# Patient Record
Sex: Female | Born: 1975 | Race: White | Hispanic: No | Marital: Married | Smoking: Former smoker
Health system: Southern US, Community
[De-identification: ages and names within clinical notes are randomized; demographics above are authoritative.]

## PROBLEM LIST (undated history)

## (undated) DIAGNOSIS — M459 Ankylosing spondylitis of unspecified sites in spine: Secondary | ICD-10-CM

## (undated) DIAGNOSIS — G932 Benign intracranial hypertension: Secondary | ICD-10-CM

## (undated) DIAGNOSIS — D729 Disorder of white blood cells, unspecified: Secondary | ICD-10-CM

## (undated) DIAGNOSIS — D709 Neutropenia, unspecified: Secondary | ICD-10-CM

## (undated) DIAGNOSIS — E041 Nontoxic single thyroid nodule: Secondary | ICD-10-CM

## (undated) DIAGNOSIS — N643 Galactorrhea not associated with childbirth: Secondary | ICD-10-CM

## (undated) DIAGNOSIS — R5382 Chronic fatigue, unspecified: Secondary | ICD-10-CM

## (undated) HISTORY — DX: Benign intracranial hypertension: G93.2

## (undated) HISTORY — DX: Chronic fatigue, unspecified: R53.82

## (undated) HISTORY — DX: Neutropenia, unspecified: D70.9

## (undated) HISTORY — DX: Nontoxic single thyroid nodule: E04.1

## (undated) HISTORY — PX: BONE MARROW BIOPSY: SHX199

## (undated) HISTORY — DX: Ankylosing spondylitis of unspecified sites in spine: M45.9

## (undated) HISTORY — DX: Disorder of white blood cells, unspecified: D72.9

## (undated) HISTORY — PX: WISDOM TOOTH EXTRACTION: SHX21

## (undated) HISTORY — DX: Galactorrhea not associated with childbirth: N64.3

---

## 1999-01-24 HISTORY — PX: AXILLARY LYMPH NODE BIOPSY: SHX5737

## 2014-08-24 ENCOUNTER — Encounter: Payer: Self-pay | Admitting: Podiatry

## 2014-08-24 ENCOUNTER — Ambulatory Visit (INDEPENDENT_AMBULATORY_CARE_PROVIDER_SITE_OTHER): Payer: Managed Care, Other (non HMO) | Admitting: Podiatry

## 2014-08-24 ENCOUNTER — Ambulatory Visit (INDEPENDENT_AMBULATORY_CARE_PROVIDER_SITE_OTHER): Payer: Managed Care, Other (non HMO)

## 2014-08-24 VITALS — BP 118/76 | HR 67 | Resp 16 | Ht 64.0 in | Wt 150.0 lb

## 2014-08-24 DIAGNOSIS — M722 Plantar fascial fibromatosis: Secondary | ICD-10-CM

## 2014-08-24 MED ORDER — METHYLPREDNISOLONE 4 MG PO TBPK: ORAL_TABLET | ORAL | Status: DC

## 2014-08-24 MED ORDER — MELOXICAM 15 MG PO TABS: 15.0000 mg | ORAL_TABLET | Freq: Every day | ORAL | Status: DC

## 2014-08-24 NOTE — Patient Instructions (Signed)

## 2014-08-24 NOTE — Progress Notes (Signed)
   Subjective:    Patient ID: Jenny Castillo, female    DOB: Apr 05, 1975, 39 y.o.   MRN: [id number]  HPI  Right heel pain for about 3 months now .   Review of Systems     Objective:   Physical Exam: I have reviewed her past medical history medications allergy surgery social history and review of systems. Pulses are strongly palpable bilateral. An neurologic sensorium is intact per Semmes-Weinstein monofilament. Deep tendon reflexes are intact bilateral and muscle strength +5 over 5 dorsiflexion plantar flexors and inverters everters all intrinsic musculature is intact. Orthopedic evaluation demonstrate all joints distal to the ankle for range of motion without crepitation. Cutaneous evaluation of a straight supple well-hydrated cutis no erythema or edema cellulitis drainage or odor. Radiograph confirm soft tissue increase in density of the plantar fascial calcaneal insertion site of the right heel. She has pain on palpation medial calcaneal tubercle of the right heel.        Assessment & Plan:  Assessment: Findings consistent with plantar fasciitis of the right heel.  Plan: Discussed etiology pathology conservative versus surgical therapies. Start her on a Medrol Dosepak to be followed by meloxicam. Injected the right heel today with Kenalog and local and aesthetic. Place her in a plantar fascial brace and a night splint. Discussed appropriate shoe gear stretching exercises ice therapy and she can modifications. I will follow-up with her in 1 month.

## 2014-09-30 ENCOUNTER — Ambulatory Visit (INDEPENDENT_AMBULATORY_CARE_PROVIDER_SITE_OTHER): Payer: Managed Care, Other (non HMO) | Admitting: Podiatry

## 2014-09-30 ENCOUNTER — Encounter: Payer: Self-pay | Admitting: Podiatry

## 2014-09-30 VITALS — BP 119/80 | HR 56 | Resp 18

## 2014-09-30 DIAGNOSIS — M722 Plantar fascial fibromatosis: Secondary | ICD-10-CM | POA: Diagnosis not present

## 2014-09-30 NOTE — Progress Notes (Signed)
She presents today for follow-up of her plantar fasciitis right foot. She states that is approximately 90% improved she states that recently she has noticed is starting to become a little bit more stiff and tender and she takes her meloxicam on those days.  Objective: Vital signs are stable she is alert and oriented 3 she has no pain on palpation medial calcaneal tubercle of the right heel. Ulcers remain palpable neurologic sensorium is intact. Muscle strength is normal. She has much decrease in edema and warmth to the right heel from previous evaluation.  Assessment: Resolving plantar fasciitis 90% right.  Plan: I encouraged her to continue her medication her braces and night splint until she is 100% improved +1 month. I will follow-up with her in 1 month if necessary. We may need to consider orthotics.

## 2014-10-19 ENCOUNTER — Ambulatory Visit (INDEPENDENT_AMBULATORY_CARE_PROVIDER_SITE_OTHER): Payer: Managed Care, Other (non HMO) | Admitting: Internal Medicine

## 2014-10-19 ENCOUNTER — Encounter: Payer: Self-pay | Admitting: Internal Medicine

## 2014-10-19 ENCOUNTER — Encounter (INDEPENDENT_AMBULATORY_CARE_PROVIDER_SITE_OTHER): Payer: Self-pay

## 2014-10-19 VITALS — BP 118/64 | HR 65 | Temp 98.1°F | Ht 63.75 in | Wt 156.0 lb

## 2014-10-19 DIAGNOSIS — M722 Plantar fascial fibromatosis: Secondary | ICD-10-CM

## 2014-10-19 DIAGNOSIS — R635 Abnormal weight gain: Secondary | ICD-10-CM | POA: Diagnosis not present

## 2014-10-19 DIAGNOSIS — R61 Generalized hyperhidrosis: Secondary | ICD-10-CM | POA: Diagnosis not present

## 2014-10-19 DIAGNOSIS — D700 Congenital agranulocytosis: Secondary | ICD-10-CM | POA: Diagnosis not present

## 2014-10-19 DIAGNOSIS — R221 Localized swelling, mass and lump, neck: Secondary | ICD-10-CM

## 2014-10-19 DIAGNOSIS — N951 Menopausal and female climacteric states: Secondary | ICD-10-CM

## 2014-10-19 DIAGNOSIS — D709 Neutropenia, unspecified: Secondary | ICD-10-CM | POA: Insufficient documentation

## 2014-10-19 DIAGNOSIS — R232 Flushing: Secondary | ICD-10-CM

## 2014-10-19 LAB — CBC WITH DIFFERENTIAL/PLATELET
Basophils Absolute: 0 10*3/uL (ref 0.0–0.1)
Basophils Relative: 0.7 % (ref 0.0–3.0)
Eosinophils Absolute: 0 10*3/uL (ref 0.0–0.7)
Eosinophils Relative: 1.5 % (ref 0.0–5.0)
HCT: 38.1 % (ref 36.0–46.0)
Hemoglobin: 12.9 g/dL (ref 12.0–15.0)
Lymphocytes Relative: 49.9 % — ABNORMAL HIGH (ref 12.0–46.0)
Lymphs Abs: 1.4 10*3/uL (ref 0.7–4.0)
MCHC: 33.8 g/dL (ref 30.0–36.0)
MCV: 93.1 fl (ref 78.0–100.0)
Monocytes Absolute: 0.4 10*3/uL (ref 0.1–1.0)
Monocytes Relative: 13.8 % — ABNORMAL HIGH (ref 3.0–12.0)
Neutro Abs: 0.9 10*3/uL — ABNORMAL LOW (ref 1.4–7.7)
Neutrophils Relative %: 34.1 % — ABNORMAL LOW (ref 43.0–77.0)
Platelets: 164 10*3/uL (ref 150.0–400.0)
RBC: 4.09 Mil/uL (ref 3.87–5.11)
RDW: 13.2 % (ref 11.5–15.5)
WBC: 2.7 10*3/uL — ABNORMAL LOW (ref 4.0–10.5)

## 2014-10-19 LAB — LUTEINIZING HORMONE: LH: 4.72 m[IU]/mL

## 2014-10-19 LAB — TSH: TSH: 2.27 u[IU]/mL (ref 0.35–4.50)

## 2014-10-19 LAB — FOLLICLE STIMULATING HORMONE: FSH: 12.3 m[IU]/mL

## 2014-10-19 NOTE — Addendum Note (Signed)
Addended by: Baldomero Lamy on: 10/19/2014 01:48 PM   Modules accepted: Orders

## 2014-10-19 NOTE — Addendum Note (Signed)
Addended by: Alvina Chou on: 10/19/2014 01:09 PM   Modules accepted: Orders

## 2014-10-19 NOTE — Progress Notes (Signed)
Pre visit review using our clinic review tool, if applicable. No additional management support is needed unless otherwise documented below in the visit note. 

## 2014-10-19 NOTE — Assessment & Plan Note (Signed)
Will check CBC with diff today If WBC < 3 will refer to [facility] or [facility] for further evaluation

## 2014-10-19 NOTE — Assessment & Plan Note (Signed)
Not bothering her at this time Will continue to monitor Continue Meloxicam as needed

## 2014-10-19 NOTE — Progress Notes (Signed)
HPI  Pt presents to the clinic today to establish care and for management of the conditions listed below. She moved from [place] 1 year ago.  Immune Neutropenia: Diagnosed in 97-98. She used to follow with a hematologist. She has had 2 bone marrow biopsies. She has had enlarged lymph nodes in various places in the past. She reports that they told her what looks like Leukemia but all the testing has been inconclusive. Her WBC usually runs around 3.  Plantar fasciitis of right foot: This only bothers her intermittently. It is usually worse first thing in the morning. She takes Meloxicam as needed with good relief.  She does feel like she has a lump in her throat. This only occurs intermittently. She only really notices it when she coughs. She denies difficulty swallowing or shortness of breath. She denies sore throat or reflux. She has had some intermittent left ear pain and a swollen lymph node in front of her left ear but this has also gotten better. She feels like she comes down with a cold and then her symptoms improve.  She is also concerned about night sweats, hot flashes and difficulty losing weight. This has been going on for a few months. She wakes up in the middle of the night, drenched in sweat. She gets hot randomly throughout the day. She adheres to a strict diet and exercises 3-5 times per week but reports she has gained 10 lbs. She is not sure at what age her mother went through menopause because she had a hysterectomy due to cancer.  Flu: never Tetanus: had one, but not sure last one LMP: Had Mirena removed in August 2015. Her last 2 periods have been 1-2 days. Pap Smear: 2014 Mammogram: 2014 Vision Screening: yearly Dentist: biannually    Past Medical History  Diagnosis Date  . Immune neutropenia   . Thyroid nodule   . Abnormal white blood cell (WBC)     low    Current Outpatient Prescriptions  Medication Sig Dispense Refill  . meloxicam (MOBIC) 15 MG tablet Take 1  tablet (15 mg total) by mouth daily. 30 tablet 3   No current facility-administered medications for this visit.    Allergies  Allergen Reactions  . Ceftin [Cefuroxime Axetil] Other (See Comments)  . Reglan [Metoclopramide] Other (See Comments)    Heavy pressure in chest , can't breathe  . Septra [Sulfamethoxazole-Trimethoprim] Other (See Comments)    Has a low blood count has to stay away from certain medications   . Sulfa Antibiotics Other (See Comments)  . Rocephin [Ceftriaxone Sodium In Dextrose] Rash    Swelling     Family History  Problem Relation Age of Onset  . Cancer Mother   . Stroke Father   . Hypertension Father   . Asthma Maternal Grandmother   . Hypertension Maternal Grandmother   . Stroke Paternal Grandmother     Social History   Social History  . Marital Status: Married    Spouse Name: N/A  . Number of Children: N/A  . Years of Education: N/A   Occupational History  . Not on file.   Social History Main Topics  . Smoking status: Former Games developer  . Smokeless tobacco: Never Used  . Alcohol Use: 0.0 oz/week    0 Standard drinks or equivalent per week  . Drug Use: No  . Sexual Activity: Yes   Other Topics Concern  . Not on file   Social History Narrative    ROS:  Constitutional: Pt  reports weight gain. Denies fever, malaise, fatigue, headache.  HEENT: Denies eye pain, eye redness, ear pain, ringing in the ears, wax buildup, runny nose, nasal congestion, bloody nose, or sore throat. Respiratory: Denies difficulty breathing, shortness of breath, cough or sputum production.   Cardiovascular: Denies chest pain, chest tightness, palpitations or swelling in the hands or feet.  Gastrointestinal: Denies abdominal pain, bloating, constipation, diarrhea or blood in the stool.  GU: Denies frequency, urgency, pain with urination, blood in urine, odor or discharge. Musculoskeletal: Denies decrease in range of motion, difficulty with gait, muscle pain or joint  pain and swelling.  Skin: Denies redness, rashes, lesions or ulcercations.  Neurological: Denies dizziness, difficulty with memory, difficulty with speech or problems with balance and coordination.  Psych: Denies anxiety, depression, SI/HI.  No other specific complaints in a complete review of systems (except as listed in HPI above).  PE:  BP 118/64 mmHg  Pulse 65  Temp(Src) 98.1 F (36.7 C) (Oral)  Ht 5' 3.75" (1.619 m)  Wt 156 lb (70.761 kg)  BMI 27.00 kg/m2  SpO2 98%  LMP 10/17/2014 Wt Readings from Last 3 Encounters:  10/19/14 156 lb (70.761 kg)  08/24/14 150 lb (68.04 kg)    General: Appears her stated age, well developed, well nourished in NAD. Skin: Warm, dry and intact. No rashes, lesions or ulcerations noted. HEENT: Head: normal shape and size; Eyes: sclera white, no icterus, conjunctiva pink; Ears: Tm's gray and intact, normal light reflex; Throat/Mouth: Teeth present, mucosa pink and moist, no lesions or ulcerations noted.  Neck: No adenopathy noted. Cardiovascular: Normal rate and rhythm. S1,S2 noted.  No murmur, rubs or gallops noted. Pulmonary/Chest: Normal effort and positive vesicular breath sounds. No respiratory distress. No wheezes, rales or ronchi noted.  Neurological: Alert and oriented.  Psychiatric: Mood and affect normal. Behavior is normal. Judgment and thought content normal.    Assessment and Plan:  Weight gain, hot flashes and night sweats:  Sounds perimenopausal Will check TSH, FSH and LH today If labs normal- she will consider going to gyn to have Mirena put back in  Lump in throat with intermittent left ear pain:  Sounds like it could be allergy related Advised her to take Allegra once daily to see if symptoms improve  RTC in 3 months or when convenient for an annual exam

## 2014-10-19 NOTE — Patient Instructions (Signed)
Menopause Menopause is the normal time of life when menstrual periods stop completely. Menopause is complete when you have missed 12 consecutive menstrual periods. It usually occurs between the ages of 48 years and 55 years. Very rarely does a woman develop menopause before the age of 40 years. At menopause, your ovaries stop producing the female hormones estrogen and progesterone. This can cause undesirable symptoms and also affect your health. Sometimes the symptoms may occur 4-5 years before the menopause begins. There is no relationship between menopause and:  Oral contraceptives.  Number of children you had.  Race.  The age your menstrual periods started (menarche). Heavy smokers and very thin women may develop menopause earlier in life. CAUSES  The ovaries stop producing the female hormones estrogen and progesterone.  Other causes include:  Surgery to remove both ovaries.  The ovaries stop functioning for no known reason.  Tumors of the pituitary gland in the brain.  Medical disease that affects the ovaries and hormone production.  Radiation treatment to the abdomen or pelvis.  Chemotherapy that affects the ovaries. SYMPTOMS   Hot flashes.  Night sweats.  Decrease in sex drive.  Vaginal dryness and thinning of the vagina causing painful intercourse.  Dryness of the skin and developing wrinkles.  Headaches.  Tiredness.  Irritability.  Memory problems.  Weight gain.  Bladder infections.  Hair growth of the face and chest.  Infertility. More serious symptoms include:  Loss of bone (osteoporosis) causing breaks (fractures).  Depression.  Hardening and narrowing of the arteries (atherosclerosis) causing heart attacks and strokes. DIAGNOSIS   When the menstrual periods have stopped for 12 straight months.  Physical exam.  Hormone studies of the blood. TREATMENT  There are many treatment choices and nearly as many questions about them. The  decisions to treat or not to treat menopausal changes is an individual choice made with your health care provider. Your health care provider can discuss the treatments with you. Together, you can decide which treatment will work best for you. Your treatment choices may include:   Hormone therapy (estrogen and progesterone).  Non-hormonal medicines.  Treating the individual symptoms with medicine (for example antidepressants for depression).  Herbal medicines that may help specific symptoms.  Counseling by a psychiatrist or psychologist.  Group therapy.  Lifestyle changes including:  Eating healthy.  Regular exercise.  Limiting caffeine and alcohol.  Stress management and meditation.  No treatment. HOME CARE INSTRUCTIONS   Take the medicine your health care provider gives you as directed.  Get plenty of sleep and rest.  Exercise regularly.  Eat a diet that contains calcium (good for the bones) and soy products (acts like estrogen hormone).  Avoid alcoholic beverages.  Do not smoke.  If you have hot flashes, dress in layers.  Take supplements, calcium, and vitamin D to strengthen bones.  You can use over-the-counter lubricants or moisturizers for vaginal dryness.  Group therapy is sometimes very helpful.  Acupuncture may be helpful in some cases. SEEK MEDICAL CARE IF:   You are not sure you are in menopause.  You are having menopausal symptoms and need advice and treatment.  You are still having menstrual periods after age 55 years.  You have pain with intercourse.  Menopause is complete (no menstrual period for 12 months) and you develop vaginal bleeding.  You need a referral to a specialist (gynecologist, psychiatrist, or psychologist) for treatment. SEEK IMMEDIATE MEDICAL CARE IF:   You have severe depression.  You have excessive vaginal bleeding.    You fell and think you have a broken bone.  You have pain when you urinate.  You develop leg or  chest pain.  You have a fast pounding heart beat (palpitations).  You have severe headaches.  You develop vision problems.  You feel a lump in your breast.  You have abdominal pain or severe indigestion. Document Released: 04/01/2003 Document Revised: 09/11/2012 Document Reviewed: 08/08/2012 ExitCare Patient Information 2015 [employer]. This information is not intended to replace advice given to you by your health care provider. Make sure you discuss any questions you have with your health care provider.  

## 2014-12-24 ENCOUNTER — Encounter: Payer: Self-pay | Admitting: Internal Medicine

## 2014-12-24 ENCOUNTER — Ambulatory Visit (INDEPENDENT_AMBULATORY_CARE_PROVIDER_SITE_OTHER): Payer: Managed Care, Other (non HMO) | Admitting: Internal Medicine

## 2014-12-24 VITALS — BP 114/80 | HR 69 | Temp 98.2°F | Wt 155.0 lb

## 2014-12-24 DIAGNOSIS — M2550 Pain in unspecified joint: Secondary | ICD-10-CM

## 2014-12-24 LAB — SEDIMENTATION RATE: Sed Rate: 10 mm/hr (ref 0–22)

## 2014-12-24 LAB — URIC ACID: Uric Acid, Serum: 4.1 mg/dL (ref 2.4–7.0)

## 2014-12-24 NOTE — Progress Notes (Signed)
Subjective:    Patient ID: Jenny Castillo, female    DOB: December 12, 1975, 39 y.o.   MRN: [id number]  HPI  Pt presents to the clinic today with c/o joint pain. She has pain in different joints at different times. Today the pain is in her left ankle. She describes the pain as shooting but stiff, "like someone is hitting me with a bat". The pain is constant but changes in intensity with activity. She denies redness or swelling of the joints. She takes Ibuprofen with some relief. She has no history of gout or autoimmune disorders. She denies injury to any of the joints.  Review of Systems      Past Medical History  Diagnosis Date  . Immune neutropenia ([place])   . Thyroid nodule   . Abnormal white blood cell (WBC)     low    Current Outpatient Prescriptions  Medication Sig Dispense Refill  . meloxicam (MOBIC) 15 MG tablet Take 1 tablet (15 mg total) by mouth daily. (Patient not taking: Reported on 12/24/2014) 30 tablet 3   No current facility-administered medications for this visit.    Allergies  Allergen Reactions  . Ceftin [Cefuroxime Axetil] Other (See Comments)  . Reglan [Metoclopramide] Other (See Comments)    Heavy pressure in chest , can't breathe  . Septra [Sulfamethoxazole-Trimethoprim] Other (See Comments)    Has a low blood count has to stay away from certain medications   . Sulfa Antibiotics Other (See Comments)  . Rocephin [Ceftriaxone Sodium In Dextrose] Rash    Swelling     Family History  Problem Relation Age of Onset  . Cancer Mother     kidney  . Stroke Father   . Hypertension Father   . Asthma Maternal Grandmother   . Hypertension Maternal Grandmother   . Stroke Paternal Grandmother     Social History   Social History  . Marital Status: Married    Spouse Name: N/A  . Number of Children: N/A  . Years of Education: N/A   Occupational History  . Not on file.   Social History Main Topics  . Smoking status: Former Research scientist (life sciences)  . Smokeless tobacco: Never Used    . Alcohol Use: 0.0 oz/week    0 Standard drinks or equivalent per week  . Drug Use: No  . Sexual Activity: Yes   Other Topics Concern  . Not on file   Social History Narrative     Constitutional: Denies fever, malaise, fatigue, headache or abrupt weight changes.  Respiratory: Denies difficulty breathing, shortness of breath, cough or sputum production.   Cardiovascular: Denies chest pain, chest tightness, palpitations or swelling in the hands or feet.  Musculoskeletal: Pt reports multiple joint pain. Denies decrease in range of motion, difficulty with gait, muscle pain or joint swelling.  Skin: Denies redness, rashes, lesions or ulcercations.    No other specific complaints in a complete review of systems (except as listed in HPI above).  Objective:   Physical Exam   BP 114/80 mmHg  Pulse 69  Temp(Src) 98.2 F (36.8 C) (Oral)  Wt 155 lb (70.308 kg)  SpO2 98%  LMP 12/14/2014 Wt Readings from Last 3 Encounters:  12/24/14 155 lb (70.308 kg)  10/19/14 156 lb (70.761 kg)  08/24/14 150 lb (68.04 kg)    General: Appears her stated age, well developed, well nourished in NAD. Skin: Warm, dry and intact. No rashes, lesions or ulcerations noted. Cardiovascular: Normal rate and rhythm. S1,S2 noted.  No murmur, rubs or  gallops noted.  Pulmonary/Chest: Normal effort and positive vesicular breath sounds. No respiratory distress. No wheezes, rales or ronchi noted.  Musculoskeletal: Normal flexion, extension and rotation of the left ankle. Pain with palpation over the medial malleolus. No signs of joint swelling. No difficulty with gait.  Neurological: Alert and oriented.   BMET No results found for: NA, K, CL, CO2, GLUCOSE, BUN, CREATININE, CALCIUM, GFRNONAA, GFRAA  Lipid Panel  No results found for: CHOL, TRIG, HDL, CHOLHDL, VLDL, LDLCALC  CBC    Component Value Date/Time   WBC 2.7* 10/19/2014 1359   RBC 4.09 10/19/2014 1359   HGB 12.9 10/19/2014 1359   HCT 38.1 10/19/2014  1359   PLT 164.0 10/19/2014 1359   MCV 93.1 10/19/2014 1359   MCHC 33.8 10/19/2014 1359   RDW 13.2 10/19/2014 1359   LYMPHSABS 1.4 10/19/2014 1359   MONOABS 0.4 10/19/2014 1359   EOSABS 0.0 10/19/2014 1359   BASOSABS 0.0 10/19/2014 1359    Hgb A1C No results found for: HGBA1C      Assessment & Plan:   Multiple joint pains:  ? Autoimmune d/o versus OA Will check ESR, RF, ANA, CCP and uric acid level today- if positive, will refer to rheumatology No joint deformity noted, I do not think xray would be of benefit at this time Start taking Meloxicam daily or Ibuprofen prn  RTC as needed or if symptoms persist or worsen

## 2014-12-24 NOTE — Patient Instructions (Signed)

## 2014-12-24 NOTE — Progress Notes (Signed)
Pre visit review using our clinic review tool, if applicable. No additional management support is needed unless otherwise documented below in the visit note. 

## 2014-12-25 LAB — ANTI-NUCLEAR AB-TITER (ANA TITER): ANA Titer 1: 1:160 {titer} — ABNORMAL HIGH

## 2014-12-25 LAB — CYCLIC CITRUL PEPTIDE ANTIBODY, IGG: Cyclic Citrullin Peptide Ab: <16 Units

## 2014-12-25 LAB — ANA: Anti Nuclear Antibody(ANA): POSITIVE — AB

## 2014-12-25 LAB — RHEUMATOID FACTOR: Rhuematoid fact SerPl-aCnc: <10 IU/mL (ref <=14)

## 2014-12-29 ENCOUNTER — Telehealth: Payer: Self-pay | Admitting: Internal Medicine

## 2014-12-29 NOTE — Telephone Encounter (Signed)
Pt called she saw her lab results on my chart but wanted to know what she needs to do next/ follow up? Nothing has changed since her visit with regina

## 2014-12-30 ENCOUNTER — Other Ambulatory Visit: Payer: Self-pay | Admitting: Internal Medicine

## 2014-12-30 DIAGNOSIS — R768 Other specified abnormal immunological findings in serum: Secondary | ICD-10-CM

## 2014-12-30 NOTE — Telephone Encounter (Signed)
Pt is aware ---see result note 

## 2015-01-19 DIAGNOSIS — R0600 Dyspnea, unspecified: Secondary | ICD-10-CM | POA: Insufficient documentation

## 2015-01-19 DIAGNOSIS — M2550 Pain in unspecified joint: Secondary | ICD-10-CM | POA: Insufficient documentation

## 2015-01-19 DIAGNOSIS — R5382 Chronic fatigue, unspecified: Secondary | ICD-10-CM | POA: Insufficient documentation

## 2015-01-19 DIAGNOSIS — R0609 Other forms of dyspnea: Secondary | ICD-10-CM

## 2015-01-19 DIAGNOSIS — R768 Other specified abnormal immunological findings in serum: Secondary | ICD-10-CM | POA: Insufficient documentation

## 2015-02-03 DIAGNOSIS — R768 Other specified abnormal immunological findings in serum: Secondary | ICD-10-CM | POA: Insufficient documentation

## 2015-03-17 ENCOUNTER — Telehealth: Payer: Self-pay | Admitting: Internal Medicine

## 2015-03-17 NOTE — Telephone Encounter (Signed)
Patient Name: Jenny Castillo  DOB: [birth year]    Initial Comment Caller states she has a fever, cough, and body aches, and fatigue. She's had a fever since last Friday. Has a blood disorder, and has questions about getting some lab work.   Nurse Assessment  Nurse: Stefano Gaul, RN, Dwana Curd Date/Time Lamount Cohen Time): 03/17/2015 12:06:12 PM  Confirm and document reason for call. If symptomatic, describe symptoms. You must click the next button to save text entered. ---Caller states she has a fever, cough, body aches. has had fever since last Friday. She has a low WBC. her daughter had the flu last week. Temp 101 during the night.  Has the patient traveled out of the country within the last 30 days? ---No  Does the patient have any new or worsening symptoms? ---Yes  Will a triage be completed? ---Yes  Related visit to physician within the last 2 weeks? ---No  Does the PT have any chronic conditions? (i.e. diabetes, asthma, etc.) ---Yes  List chronic conditions. ---low WBC  Is the patient pregnant or possibly pregnant? (Ask all females between the ages of 40-55) ---No  Is this a behavioral health or substance abuse call? ---No     Guidelines    Guideline Title Affirmed Question Affirmed Notes  Influenza - Seasonal Fever present > 3 days (72 hours)    Final Disposition User   See Physician within 24 Hours Stringer, RN, Dwana Curd    Comments  Pt would like WBC checked  Appt scheduled for 03/18/2015 at 9 am with [facility]   Referrals  REFERRED TO PCP OFFICE   Disagree/Comply: Danella Maiers

## 2015-03-17 NOTE — Telephone Encounter (Signed)
Appointment 2/23 at 0900 with R.Baity.

## 2015-03-18 ENCOUNTER — Encounter: Payer: Self-pay | Admitting: Internal Medicine

## 2015-03-18 ENCOUNTER — Ambulatory Visit (INDEPENDENT_AMBULATORY_CARE_PROVIDER_SITE_OTHER): Payer: Managed Care, Other (non HMO) | Admitting: Internal Medicine

## 2015-03-18 VITALS — BP 116/70 | HR 76 | Temp 97.8°F | Wt 153.0 lb

## 2015-03-18 DIAGNOSIS — J069 Acute upper respiratory infection, unspecified: Secondary | ICD-10-CM

## 2015-03-18 DIAGNOSIS — D709 Neutropenia, unspecified: Secondary | ICD-10-CM

## 2015-03-18 DIAGNOSIS — B9789 Other viral agents as the cause of diseases classified elsewhere: Secondary | ICD-10-CM

## 2015-03-18 DIAGNOSIS — H6693 Otitis media, unspecified, bilateral: Secondary | ICD-10-CM | POA: Diagnosis not present

## 2015-03-18 LAB — CBC WITH DIFFERENTIAL/PLATELET
Basophils Absolute: 0 10*3/uL (ref 0.0–0.1)
Basophils Relative: 0.7 % (ref 0.0–3.0)
Eosinophils Absolute: 0.1 10*3/uL (ref 0.0–0.7)
Eosinophils Relative: 3 % (ref 0.0–5.0)
HCT: 39.2 % (ref 36.0–46.0)
Hemoglobin: 13.3 g/dL (ref 12.0–15.0)
Lymphocytes Relative: 59.4 % — ABNORMAL HIGH (ref 12.0–46.0)
Lymphs Abs: 1.4 10*3/uL (ref 0.7–4.0)
MCHC: 33.9 g/dL (ref 30.0–36.0)
MCV: 90.7 fl (ref 78.0–100.0)
Monocytes Absolute: 0.5 10*3/uL (ref 0.1–1.0)
Monocytes Relative: 19.3 % — ABNORMAL HIGH (ref 3.0–12.0)
Neutro Abs: 0.4 10*3/uL — ABNORMAL LOW (ref 1.4–7.7)
Neutrophils Relative %: 17.6 % — ABNORMAL LOW (ref 43.0–77.0)
Platelets: 133 10*3/uL — ABNORMAL LOW (ref 150.0–400.0)
RBC: 4.33 Mil/uL (ref 3.87–5.11)
RDW: 13 % (ref 11.5–15.5)
WBC: 2.4 10*3/uL — ABNORMAL LOW (ref 4.0–10.5)

## 2015-03-18 MED ORDER — HYDROCODONE-HOMATROPINE 5-1.5 MG/5ML PO SYRP: 5.0000 mL | ORAL_SOLUTION | Freq: Three times a day (TID) | ORAL | Status: DC | PRN

## 2015-03-18 MED ORDER — AMOXICILLIN 500 MG PO CAPS: 500.0000 mg | ORAL_CAPSULE | Freq: Three times a day (TID) | ORAL | Status: DC

## 2015-03-18 NOTE — Patient Instructions (Signed)

## 2015-03-18 NOTE — Progress Notes (Signed)
Pre visit review using our clinic review tool, if applicable. No additional management support is needed unless otherwise documented below in the visit note. 

## 2015-03-18 NOTE — Progress Notes (Signed)
HPI  Pt presents to the clinic today with c/o ear pain, runny nose, cough and shortness of breath. This started 1 week ago.  She is blowing clear mucous out of her nose. The cough is non productive. She has run fevers up to 101. She has tried Ibuprofen, Claritin, Mucinix D with some relief. She denies history of seasonal allergies or breathing problems, but she does have immune neutropenia. She has had sick contacts.  Review of Systems      Past Medical History  Diagnosis Date  . Immune neutropenia ([facility])   . Thyroid nodule   . Abnormal white blood cell (WBC)     low    Family History  Problem Relation Age of Onset  . Cancer Mother     kidney  . Stroke Father   . Hypertension Father   . Asthma Maternal Grandmother   . Hypertension Maternal Grandmother   . Stroke Paternal Grandmother     Social History   Social History  . Marital Status: Married    Spouse Name: N/A  . Number of Children: N/A  . Years of Education: N/A   Occupational History  . Not on file.   Social History Main Topics  . Smoking status: Former Games developer  . Smokeless tobacco: Never Used  . Alcohol Use: 0.0 oz/week    0 Standard drinks or equivalent per week  . Drug Use: No  . Sexual Activity: Yes   Other Topics Concern  . Not on file   Social History Narrative    Allergies  Allergen Reactions  . Ceftin [Cefuroxime Axetil] Other (See Comments)  . Reglan [Metoclopramide] Other (See Comments)    Heavy pressure in chest , can't breathe  . Septra [Sulfamethoxazole-Trimethoprim] Other (See Comments)    Has a low blood count has to stay away from certain medications   . Sulfa Antibiotics Other (See Comments)  . Rocephin [Ceftriaxone Sodium In Dextrose] Rash    Swelling      Constitutional: Positive headache, fatigue and fever. Denies abrupt weight changes.  HEENT:  Positive ear pain, runny nose. Denies eye redness, eye pain, pressure behind the eyes, facial pain, nasal congestion, ringing in the  ears, wax buildup, or bloody nose. Respiratory: Positive cough and shortness of breath. Denies difficulty breathing.  Cardiovascular: Denies chest pain, chest tightness, palpitations or swelling in the hands or feet.   No other specific complaints in a complete review of systems (except as listed in HPI above).  Objective:   BP 116/70 mmHg  Pulse 76  Temp(Src) 97.8 F (36.6 C) (Oral)  Wt 153 lb (69.4 kg)  SpO2 98%  LMP 02/19/2015 Wt Readings from Last 3 Encounters:  03/18/15 153 lb (69.4 kg)  12/24/14 155 lb (70.308 kg)  10/19/14 156 lb (70.761 kg)     General: Appears her stated age,  in NAD. HEENT: Head: normal shape and size, no sinus tenderness noted; Eyes: sclera white, no icterus, conjunctiva pink; Ears: Tm's red but intact, distorted light reflex, + mucoid effusion bilaterally; Nose: mucosa boggy and moist, septum midline; Throat/Mouth: + PND. Teeth present, mucosa erythematous and moist, no exudate noted, no lesions or ulcerations noted.  Neck: No cervical lymphadenopathy.  Cardiovascular: Normal rate and rhythm. S1,S2 noted.  No murmur, rubs or gallops noted.  Pulmonary/Chest: Normal effort and positive vesicular breath sounds. No respiratory distress. No wheezes, rales or ronchi noted.      Assessment & Plan:   Viral Upper Respiratory Infection with Cough:  Get some  rest and drink plenty of water Continue Ibuprofen as needed eRx for Hycodan cough syrup  Bilateral Otitis Media:  eRx for Amoxil 500 mg TID x 10 days Ibuprofen for fever and inflammation  Immune Neutropenia:  CBC with diff today  RTC as needed or if symptoms persist.

## 2015-08-10 ENCOUNTER — Telehealth: Payer: Self-pay | Admitting: Internal Medicine

## 2015-08-10 NOTE — Telephone Encounter (Signed)
Patient Name: Jenny Castillo  DOB: 05-01-1975    Initial Comment Caller states having neck and head pain   Nurse Assessment  Nurse: Annye English RN, Denise Date/Time (Eastern Time): 08/10/2015 1:26:06 PM  Confirm and document reason for call. If symptomatic, describe symptoms. You must click the next button to save text entered. ---Caller states having neck and head pain  Has the patient traveled out of the country within the last 30 days? ---Not Applicable  Does the patient have any new or worsening symptoms? ---Yes  Will a triage be completed? ---Yes  Related visit to physician within the last 2 weeks? ---No  Does the PT have any chronic conditions? (i.e. diabetes, asthma, etc.) ---Yes  List chronic conditions. ---Immune Neutropenia  Is the patient pregnant or possibly pregnant? (Ask all females between the ages of 17-55) ---No  Is this a behavioral health or substance abuse call? ---No     Guidelines    Guideline Title Affirmed Question Affirmed Notes  Headache [1] SEVERE headache (e.g., excruciating) AND [2] "worst headache" of life    Final Disposition User   Go to ED Now (or PCP triage) Annye English, RN, Denise    Comments  Pt reluctant to go to ER. Advised to hold while I call the [facility] to see if she can be seen at the office today instead of going to ER. Advised it is important she be seen if the [facility] is unable to get her an appt due to her hx of Viral Meningitis. Verb understanding. Called [facility] backline and spoke to [place]ebel, office nurse w/pt report given. She states there are not any appts available today and the pt needs to proceed to ER as advised. Advised pt of above. Verb understanding.   Referrals  GO TO FACILITY UNDECIDED  GO TO FACILITY UNDECIDED   Disagree/Comply: Comply

## 2015-08-10 NOTE — Telephone Encounter (Signed)
Agree with advice given

## 2015-11-02 ENCOUNTER — Encounter: Payer: Self-pay | Admitting: Internal Medicine

## 2015-11-02 ENCOUNTER — Ambulatory Visit (INDEPENDENT_AMBULATORY_CARE_PROVIDER_SITE_OTHER): Payer: Managed Care, Other (non HMO) | Admitting: Internal Medicine

## 2015-11-02 VITALS — BP 120/80 | Temp 98.5°F | Wt 160.0 lb

## 2015-11-02 DIAGNOSIS — M2550 Pain in unspecified joint: Secondary | ICD-10-CM

## 2015-11-02 DIAGNOSIS — R5383 Other fatigue: Secondary | ICD-10-CM

## 2015-11-02 DIAGNOSIS — D709 Neutropenia, unspecified: Secondary | ICD-10-CM

## 2015-11-02 DIAGNOSIS — R222 Localized swelling, mass and lump, trunk: Secondary | ICD-10-CM

## 2015-11-02 LAB — CBC WITH DIFFERENTIAL/PLATELET
Basophils Absolute: 0 10*3/uL (ref 0.0–0.1)
Basophils Relative: 0.5 % (ref 0.0–3.0)
Eosinophils Absolute: 0 10*3/uL (ref 0.0–0.7)
Eosinophils Relative: 1.2 % (ref 0.0–5.0)
HCT: 39.5 % (ref 36.0–46.0)
Hemoglobin: 13.5 g/dL (ref 12.0–15.0)
Lymphocytes Relative: 26.6 % (ref 12.0–46.0)
Lymphs Abs: 0.8 10*3/uL (ref 0.7–4.0)
MCHC: 34.2 g/dL (ref 30.0–36.0)
MCV: 91.1 fl (ref 78.0–100.0)
Monocytes Absolute: 0.5 10*3/uL (ref 0.1–1.0)
Monocytes Relative: 17.6 % — ABNORMAL HIGH (ref 3.0–12.0)
Neutro Abs: 1.6 10*3/uL (ref 1.4–7.7)
Neutrophils Relative %: 54.1 % (ref 43.0–77.0)
Platelets: 165 10*3/uL (ref 150.0–400.0)
RBC: 4.34 Mil/uL (ref 3.87–5.11)
RDW: 12.6 % (ref 11.5–15.5)
WBC: 3 10*3/uL — ABNORMAL LOW (ref 4.0–10.5)

## 2015-11-02 LAB — T4, FREE: Free T4: 0.67 ng/dL (ref 0.60–1.60)

## 2015-11-02 LAB — TSH: TSH: 2.68 u[IU]/mL (ref 0.35–4.50)

## 2015-11-02 NOTE — Patient Instructions (Signed)
Neutropenia Neutropenia is a condition that occurs when the level of a certain type of white blood cell (neutrophil) in your body becomes lower than normal. Neutrophils are made in the bone marrow and fight infections. These cells protect against bacteria and viruses. The fewer neutrophils you have, and the longer your body remains without them, the greater your risk of getting a severe infection becomes. CAUSES  The cause of neutropenia may be hard to determine. However, it is usually due to 3 main problems:   Decreased production of neutrophils. This may be due to:  Certain medicines such as chemotherapy.  Genetic problems.  Cancer.  Radiation treatments.  Vitamin deficiency.  Some pesticides.  Increased destruction of neutrophils. This may be due to:  Overwhelming infections.  Hemolytic anemia. This is when the body destroys its own blood cells.  Chemotherapy.  Neutrophils moving to areas of the body where they cannot fight infections. This may be due to:  Dialysis procedures.  Conditions where the spleen becomes enlarged. Neutrophils are held in the spleen and are not available to the rest of the body.  Overwhelming infections. The neutrophils are held in the area of the infection and are not available to the rest of the body. SYMPTOMS  There are no specific symptoms of neutropenia. The lack of neutrophils can result in an infection, and an infection can cause various problems. DIAGNOSIS  Diagnosis is made by a blood test. A complete blood count is performed. The normal level of neutrophils in human blood differs with age and race. Infants have lower counts than older children and adults. African Americans have lower counts than Caucasians or Asians. The average adult level is 1500 cells/mm3 of blood. Neutrophil counts are interpreted as follows:  Greater than 1000 cells/mm3 gives normal protection against infection.  500 to 1000 cells/mm3 gives an increased risk for  infection.  200 to 500 cells/mm3 is a greater risk for severe infection.  Lower than 200 cells/mm3 is a marked risk of infection. This may require hospitalization and treatment with antibiotic medicines. TREATMENT  Treatment depends on the underlying cause, severity, and presence of infections or symptoms. It also depends on your health. Your caregiver will discuss the treatment plan with you. Mild cases are often easily treated and have a good outcome. Preventative measures may also be started to limit your risk of infections. Treatment can include:  Taking antibiotics.  Stopping medicines that are known to cause neutropenia.  Correcting nutritional deficiencies by eating green vegetables to supply folic acid and taking vitamin B supplements.  Stopping exposure to pesticides if your neutropenia is related to pesticide exposure.  Taking a blood growth factor called sargramostim, pegfilgrastim, or filgrastim if you are undergoing chemotherapy for cancer. This stimulates white blood cell production.  Removal of the spleen if you have Felty's syndrome and have repeated infections. HOME CARE INSTRUCTIONS   Follow your caregiver's instructions about when you need to have blood work done.  Wash your hands often. Make sure others who come in contact with you also wash their hands.  Wash raw fruits and vegetables before eating them. They can carry bacteria and fungi.  Avoid people with colds or spreadable (contagious) diseases (chickenpox, herpes zoster, influenza).  Avoid large crowds.  Avoid construction areas. The dust can release fungus into the air.  Be cautious around children in daycare or school environments.  Take care of your respiratory system by coughing and deep breathing.  Bathe daily.  Protect your skin from cuts and   burns.  Do not work in the garden or with flowers and plants.  Care for the mouth before and after meals by brushing with a soft toothbrush. If you have  mucositis, do not use mouthwash. Mouthwash contains alcohol and can dry out the mouth even more.  Clean the area between the genitals and the anus (perineal area) after urination and bowel movements. Women need to wipe from front to back.  Use a water soluble lubricant during sexual intercourse and practice good hygiene after. Do not have intercourse if you are severely neutropenic. Check with your caregiver for guidelines.  Exercise daily as tolerated.  Avoid people who were vaccinated with a live vaccine in the past 30 days. You should not receive live vaccines (polio, typhoid).  Do not provide direct care for pets. Avoid animal droppings. Do not clean litter boxes and bird cages.  Do not share food utensils.  Do not use tampons, enemas, or rectal suppositories unless directed by your caregiver.  Use an electric razor to remove hair.  Wash your hands after handling magazines, letters, and newspapers. SEEK IMMEDIATE MEDICAL CARE IF:   You have a fever.  You have chills or start to shake.  You feel nauseous or vomit.  You develop mouth sores.  You develop aches and pains.  You have redness and swelling around open wounds.  Your skin is warm to the touch.  You have pus coming from your wounds.  You develop swollen lymph nodes.  You feel weak or fatigued.  You develop red streaks on the skin. MAKE SURE YOU:  Understand these instructions.  Will watch your condition.  Will get help right away if you are not doing well or get worse.   This information is not intended to replace advice given to you by your health care provider. Make sure you discuss any questions you have with your health care provider.   Document Released: 07/01/2001 Document Revised: 04/03/2011 Document Reviewed: 07/22/2014 Elsevier Interactive Patient Education 2016 [employer]  

## 2015-11-03 ENCOUNTER — Telehealth: Payer: Self-pay | Admitting: Internal Medicine

## 2015-11-03 ENCOUNTER — Encounter: Payer: Self-pay | Admitting: Internal Medicine

## 2015-11-03 LAB — T3: T3, Total: 113 ng/dL (ref 76–181)

## 2015-11-03 NOTE — Telephone Encounter (Signed)
I will once they are all back, 1 result was still pending.

## 2015-11-03 NOTE — Telephone Encounter (Signed)
Pt called checking on her lab results she stated you can put results on my chart

## 2015-11-03 NOTE — Progress Notes (Signed)
Subjective:    Patient ID: Jenny Castillo, female    DOB: 07-01-1975, 40 y.o.   MRN: [id number]  HPI  Pt presents to the clinic today with c/o a mass above her left collar bone. She noticed a few weeks ago. She reports it has gotten bigger in size. It is not tender. She thinks it is a lymph node. She reports she has had other lymph nodes pop up in the past, but her prior PCP was never concerned about it. She denies easy bruising, weight loss or abnormal masses in other areas at this time. She does have fatigue and multiple joint pains, ANA was positive, so she was referred to rheumatology. She saw Dr. West Pugh. A bunch of labs were drawn. She told her that the ANA was a false positive. She told her that there was nothing wrong with her.  She reports she did have a thyroid nodule once that showed up on a ultrasound, but has not had it evaluated in many years. She reports she has a history of immune neutropenia and used to follow with a hematologist and thinks she needs a referral to see a hematologist here.  Review of Systems      Past Medical History:  Diagnosis Date  . Abnormal white blood cell (WBC)    low  . Immune neutropenia (HCC)   . Thyroid nodule     Current Outpatient Prescriptions  Medication Sig Dispense Refill  . meloxicam (MOBIC) 15 MG tablet Take 1 tablet (15 mg total) by mouth daily. 30 tablet 3   No current facility-administered medications for this visit.     Allergies  Allergen Reactions  . Ceftin [Cefuroxime Axetil] Other (See Comments)  . Reglan [Metoclopramide] Other (See Comments)    Heavy pressure in chest , can't breathe  . Septra [Sulfamethoxazole-Trimethoprim] Other (See Comments)    Has a low blood count has to stay away from certain medications   . Sulfa Antibiotics Other (See Comments)  . Rocephin [Ceftriaxone Sodium In Dextrose] Rash    Swelling     Family History  Problem Relation Age of Onset  . Cancer Mother     kidney  . Stroke Father   .  Hypertension Father   . Asthma Maternal Grandmother   . Hypertension Maternal Grandmother   . Stroke Paternal Grandmother     Social History   Social History  . Marital status: Married    Spouse name: N/A  . Number of children: N/A  . Years of education: N/A   Occupational History  . Not on file.   Social History Main Topics  . Smoking status: Former Games developer  . Smokeless tobacco: Never Used  . Alcohol use 0.0 oz/week  . Drug use: No  . Sexual activity: Yes   Other Topics Concern  . Not on file   Social History Narrative  . No narrative on file     Constitutional: Pt reports fatigue. Denies fever, malaise, headache or abrupt weight changes.  HEENT: Denies eye pain, eye redness, ear pain, ringing in the ears, wax buildup, runny nose, nasal congestion, bloody nose, or sore throat. Respiratory: Denies difficulty breathing, shortness of breath, cough or sputum production.   Cardiovascular: Denies chest pain, chest tightness, palpitations or swelling in the hands or feet.  Musculoskeletal: Pt reports joint pain. decrease in range of motion, difficulty with gait, muscle pain or joint swelling.  Skin: Pt reports nodule above left clavicle. Denies redness, rashes, or ulcercations.    No  other specific complaints in a complete review of systems (except as listed in HPI above).  Objective:   Physical Exam  BP 120/80   Temp 98.5 F (36.9 C) (Oral)   Wt 160 lb (72.6 kg)   LMP 10/05/2015   SpO2 98%   BMI 27.68 kg/m  Wt Readings from Last 3 Encounters:  11/02/15 160 lb (72.6 kg)  03/18/15 153 lb (69.4 kg)  12/24/14 155 lb (70.3 kg)    General: Appears her stated age, well developed, well nourished in NAD. Skin: Very deep, supraclavicular mass noted, round about 0.5 cm. Neck:  Neck supple, trachea midline. No masses, lumps or thyromegaly present.  Musculoskeletal: Normal range of motion. No signs of joint swelling. Neurological: Alert and oriented. Cranial nerves II-XII  grossly intact. Coordination normal.        Assessment & Plan:   Mass of supraclavicular area:  I told her we could try to ultfasound it, but I though it would be too deep CT chest would offer a better look but she wants to hold off at this time Will monitor  If persist, will pursue imaging  Immune neutropenia:  CBC with diff today Referral placed to hematology  Fatigue and joint pains:  Ongoing Rheum workup negative If hematology does not find anything, we can refer to a different rheumatologist (at [place], [facility] or [facility])  RTC as needed Nicki Reaper, NP

## 2015-11-04 ENCOUNTER — Ambulatory Visit: Payer: Managed Care, Other (non HMO) | Admitting: Internal Medicine

## 2015-11-16 ENCOUNTER — Inpatient Hospital Stay: Payer: Managed Care, Other (non HMO) | Attending: Internal Medicine | Admitting: Internal Medicine

## 2015-11-16 ENCOUNTER — Inpatient Hospital Stay: Payer: Managed Care, Other (non HMO)

## 2015-11-16 ENCOUNTER — Other Ambulatory Visit: Payer: Self-pay

## 2015-11-16 DIAGNOSIS — R232 Flushing: Secondary | ICD-10-CM | POA: Insufficient documentation

## 2015-11-16 DIAGNOSIS — R5383 Other fatigue: Secondary | ICD-10-CM | POA: Diagnosis not present

## 2015-11-16 DIAGNOSIS — D709 Neutropenia, unspecified: Secondary | ICD-10-CM

## 2015-11-16 DIAGNOSIS — R61 Generalized hyperhidrosis: Secondary | ICD-10-CM | POA: Diagnosis not present

## 2015-11-16 DIAGNOSIS — D72819 Decreased white blood cell count, unspecified: Secondary | ICD-10-CM | POA: Diagnosis not present

## 2015-11-16 DIAGNOSIS — M542 Cervicalgia: Secondary | ICD-10-CM | POA: Diagnosis not present

## 2015-11-16 DIAGNOSIS — E049 Nontoxic goiter, unspecified: Secondary | ICD-10-CM

## 2015-11-16 LAB — CBC WITH DIFFERENTIAL/PLATELET
Basophils Absolute: 0 10*3/uL (ref 0–0.1)
Basophils Relative: 1 %
Eosinophils Absolute: 0.1 10*3/uL (ref 0–0.7)
Eosinophils Relative: 2 %
HCT: 41.1 % (ref 35.0–47.0)
Hemoglobin: 14.1 g/dL (ref 12.0–16.0)
Lymphocytes Relative: 24 %
Lymphs Abs: 0.6 10*3/uL — ABNORMAL LOW (ref 1.0–3.6)
MCH: 31.3 pg (ref 26.0–34.0)
MCHC: 34.3 g/dL (ref 32.0–36.0)
MCV: 91.4 fL (ref 80.0–100.0)
Monocytes Absolute: 0.4 10*3/uL (ref 0.2–0.9)
Monocytes Relative: 17 %
Neutro Abs: 1.4 10*3/uL (ref 1.4–6.5)
Neutrophils Relative %: 56 %
Platelets: 162 10*3/uL (ref 150–440)
RBC: 4.49 MIL/uL (ref 3.80–5.20)
RDW: 12.1 % (ref 11.5–14.5)
WBC: 2.5 10*3/uL — ABNORMAL LOW (ref 3.6–11.0)

## 2015-11-16 LAB — SEDIMENTATION RATE: Sed Rate: 6 mm/hr (ref 0–20)

## 2015-11-16 NOTE — Progress Notes (Signed)
Patient here for consultation regarding low white cell count, multiple swollen lymph glands.

## 2015-11-16 NOTE — Progress Notes (Signed)
Long-standing leukopenia . Patient reports having been worked up extensively for this in [place] at the [facility].  She apparently underwent a bone marrow biopsy a few years ago and also an excision AND axillary lymph node that was reported to her as benign. She was also seen by Rheumatology and Endocrinology recently for a positive ANA and elevated titers of anti-thyroglobulin antibody.  Today she comes in reporting tightness in her neck AND tenderness along her thyroid She is also worried about small lymph node that she felt in her left supraclavicular area.  She's also been having stiffness and pain in multiple joints including the knees, ankles, finger joints and elbows.  She is been feeling warm and flushed in her face with a reddish rash over her canal or areas and ears and the neck. She's also noted increased sensitivity to pressure over the skin and some generalized burning sensation. She denies any headaches as any palpitations. No loss of weight appetite is great. She is a mother to 3 children and states that she is always been tired.   I suspect that she may have autoimmune thyroiditis, she may be in the hyperthyroid phase of the thyroiditis. I will repeat her thyroid function. Repeat her in a deceased at titers have increased. Generalized lymphadenopathy that appears to be nonspecific and leukopenia, monocytosis and multiple sites of arthralgias all point to an underlying autoimmune disease. Or hyperthyroidism [facility] [place] reviewed him thoroughly before ordering additional testing. For today and will repeat her CBC and review her peripheral blood smear will send off for flow cytometry for leukemia lymphoma panel. An ultrasound of the spleen and an ultrasound of the thyroid has been requested. I will see her back next week. The interim medicine as I get the results if I feel the need for her to see the endocrinologist  sooner than later we'll get those appointments made.   Physical Exam:  Facial flushing noted.  Dermatographism noted.  Slighlty enlarged thyroid that is symmetric bilaterally, slightly tender to touch has been noted.   Increased sensitivity to touch in the neck and over the upper chest has been noted.  Small subcentimeter lymph nodes for felt in bilateral inguinal areas but none felt in the axillary or clavicular or cervical areas. No lymph nodes felt in the mandibular area .  Abdomen did not show any evidence of hepatomegaly, cannot appreciate splenomegaly  Bowel sounds normal   Extremities are without edema.  No joint deformities and no rheumatoid nodules Range of motion of all joints appears to be normal.  CNS: alert awake oriented 3 no gross focal sensorimotor deficits.   Psychiatric exam shows her to be normal mood memory intact.

## 2015-11-17 LAB — THYROID PANEL WITH TSH
Free Thyroxine Index: 1.8 (ref 1.2–4.9)
T3 Uptake Ratio: 27 % (ref 24–39)
T4, Total: 6.7 ug/dL (ref 4.5–12.0)
TSH: 3.28 u[IU]/mL (ref 0.450–4.500)

## 2015-11-17 LAB — ANA W/REFLEX: Anti Nuclear Antibody(ANA): NEGATIVE

## 2015-11-19 ENCOUNTER — Ambulatory Visit
Admission: RE | Admit: 2015-11-19 | Discharge: 2015-11-19 | Disposition: A | Discharge status: Home / Self Care | Payer: Managed Care, Other (non HMO) | Source: Ambulatory Visit | Attending: Internal Medicine | Admitting: Internal Medicine

## 2015-11-19 DIAGNOSIS — E049 Nontoxic goiter, unspecified: Secondary | ICD-10-CM

## 2015-11-19 DIAGNOSIS — E041 Nontoxic single thyroid nodule: Secondary | ICD-10-CM | POA: Diagnosis not present

## 2015-11-19 DIAGNOSIS — D709 Neutropenia, unspecified: Secondary | ICD-10-CM | POA: Diagnosis present

## 2015-11-19 LAB — COMP PANEL: LEUKEMIA/LYMPHOMA

## 2015-11-23 ENCOUNTER — Inpatient Hospital Stay (HOSPITAL_BASED_OUTPATIENT_CLINIC_OR_DEPARTMENT_OTHER): Payer: Managed Care, Other (non HMO) | Admitting: Internal Medicine

## 2015-11-23 VITALS — BP 119/78 | HR 69 | Temp 97.6°F | Wt 161.6 lb

## 2015-11-23 DIAGNOSIS — D72819 Decreased white blood cell count, unspecified: Secondary | ICD-10-CM

## 2015-11-23 DIAGNOSIS — R232 Flushing: Secondary | ICD-10-CM

## 2015-11-23 DIAGNOSIS — R61 Generalized hyperhidrosis: Secondary | ICD-10-CM

## 2015-11-23 DIAGNOSIS — M542 Cervicalgia: Secondary | ICD-10-CM | POA: Diagnosis not present

## 2015-11-23 DIAGNOSIS — D709 Neutropenia, unspecified: Secondary | ICD-10-CM

## 2015-11-23 DIAGNOSIS — R5383 Other fatigue: Secondary | ICD-10-CM | POA: Diagnosis not present

## 2015-11-23 NOTE — Progress Notes (Signed)
Ms. Daddario returns today to review their blood work ordered at her last visit. Since her last visit she has been feeling much improved in terms of her fatigue facial flushing and redness over her neck pain over her thyroid area and her fatigue have all improved. Today she appears much more comfortable. Examination shows no rash. Her vitals today are noted to be stable with blood pressure 119/70 pulse 69 temperature 97.6.   Lab review  ANA returned as negative. White cell count was low at 2.5 at her last visit hemoglobin 14 platelets 162. Differential did not show any evidence of use and eosinophilia or monocytosis.  A peripheral blood flow cytometry yielded no evidence of acute or chronic leukemia.  Thyroid function tests also are reported as normal.    Ultrasound showed normal spleen. Ultrasound of the thyroid showed a normal thyroid with no folic chills no enlargement.  Outside records from [facility] [place] [facility] have been received. She had 2 bone marrow biopsies  in 2002 and  2000. 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 However it was later amended by  [facility] as reactive hematogones.   A repeat bone marrow the second time in 2002 yielded normal bone marrow with no evidence of leukemia or lymphoma . And normal cytogenetics.  Review of her lab work back and also showed leukocytopenia with a WBC count 2 back in 2000.   Impression and plan: Based on review of her old records and review of her labs from her last visit it does not appear that her leukocytopenia has changed much over a span of 16 years. This together with the fact that she has had 2 bone marrow biopsies in the past which have not consistently shown any suspicious leukemia /lymphoma, and a normal peripheral blood flow cytometry from her blood drawn last week argue against any underlying clonal hematologic disorder. There appears to be no evidence of thyroiditis.   Her generalized  symptoms of fatigue, facial flushing, arthralgias and night sweats continue to occur intermittently last for a few weeks. It isl not clear whether these symptoms can be  attributed it to  hormone imbalance because of her perimenopausal age.  I also wonder if this is an allergic reaction or histamine related symptoms. I suggested that she see an endocrinologist for further evaluation.  Referral has been arranged. With regard to her follow up leukopenia she prefers to follow with her primary care physician and wishes to return here only if her counts  change from her baseline.   She knows to call for any new symptoms in the interim.

## 2015-11-24 ENCOUNTER — Ambulatory Visit: Payer: Managed Care, Other (non HMO)

## 2015-11-25 NOTE — Progress Notes (Signed)
[facility]  [phone] [phone]  ID: Laqueta Due OB: [birth year]  MR#: [id number]  JEH#:[id number]  Patient Care Team: Jearld Fenton, NP as PCP - General (Internal Medicine)  CHIEF COMPLAINT: Neutropenia    HPI:  She is known to have long-standing leukopenia . Patient reports having been worked up extensively for this in [place] at the [facility].  She apparently underwent a bone marrow biopsy a few years ago and also an excision of an axillary lymph node that was reported to her as benign.  She was also seen by Rheumatology and Endocrinology recently for a positive ANA and elevated titers of anti-thyroglobulin antibody.  Today she comes in reporting tightness in her neck AND tenderness along her thyroid She is also worried about small lymph node that she felt in her left supraclavicular area.  She's also been having stiffness and pain in multiple joints including the knees, ankles, finger joints and elbows.  She is been feeling warm and flushed in her face with a reddish rash over her face and ears and the neck.  She's also noted increased sensitivity to touch over the skin and generalized burning sensation. She denies any headaches or any palpitations.  No loss of weight appetite is great.  She states that she is always been tired.  REVIEW OF SYSTEMS:   ROS  As per HPI. Otherwise, a complete review of systems is negative.  PAST MEDICAL HISTORY: Past Medical History:  Diagnosis Date  . Abnormal white blood cell (WBC)    low  . Chronic fatigue   . 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 Thyroid nodule     PAST SURGICAL HISTORY: Past Surgical History:  Procedure Laterality Date  . AXILLARY LYMPH NODE BIOPSY    . CESAREAN SECTION    . WISDOM TOOTH EXTRACTION      FAMILY HISTORY: Family History  Problem Relation Age of Onset  . Cancer Mother     kidney  . Stroke Father   . Hypertension Father   . Asthma Maternal  Grandmother   . Hypertension Maternal Grandmother   . Stroke Paternal Grandmother     ADVANCED DIRECTIVES (Y/N):  N  HEALTH MAINTENANCE: Social History  Substance Use Topics  . Smoking status: Former Research scientist (life sciences)  . Smokeless tobacco: Never Used  . Alcohol use 0.0 oz/week     Colonoscopy:  PAP:  Bone density:  Lipid panel:  Allergies  Allergen Reactions  . Ceftin [Cefuroxime Axetil] Other (See Comments)  . Reglan [Metoclopramide] Other (See Comments)    Heavy pressure in chest , can't breathe  . Septra [Sulfamethoxazole-Trimethoprim] Other (See Comments)    Has a low blood count has to stay away from certain medications   . Sulfa Antibiotics Other (See Comments)  . Rocephin [Ceftriaxone Sodium In Dextrose] Rash    Swelling     No current outpatient prescriptions on file.   No current facility-administered medications for this visit.     OBJECTIVE: There were no vitals filed for this visit.   There is no height or weight on file to calculate BMI.   There is no height or weight on file to calculate BSA.  ECOG FS:1 - Symptomatic but completely ambulatory  Physical Exam  Well built, well nourished, in mild distress Vitals are stable Facial flushing noted.  Dermatographism noted.  Slighlty enlarged thyroid that is symmetric bilaterally, slightly tender to touch has been noted.   Increased sensitivity to touch in the neck and  over the upper chest has been noted.  Small subcentimeter lymph nodes for felt in bilateral inguinal areas but none felt in the axillary or clavicular or cervical areas. No lymph nodes felt in the mandibular area .  Abdomen did not show any evidence of hepatomegaly, cannot appreciate splenomegaly  Bowel sounds normal   Extremities are without edema.  No joint deformities and no rheumatoid nodules Range of motion of all joints appears to be normal.  CNS: alert awake oriented 3 no gross focal sensorimotor deficits.   Psychiatric exam shows her  to be normal mood memory intact.     LAB RESULTS:  No results found for: NA, K, CL, CO2, GLUCOSE, BUN, CREATININE, CALCIUM, PROT, ALBUMIN, AST, ALT, ALKPHOS, BILITOT, GFRNONAA, GFRAA  Lab Results  Component Value Date   WBC 2.5 (L) 11/16/2015   NEUTROABS 1.4 11/16/2015   HGB 14.1 11/16/2015   HCT 41.1 11/16/2015   MCV 91.4 11/16/2015   PLT 162 11/16/2015     STUDIES:    Assessment and PLAN:   Non specific systemic symptoms as noted in HPI  In association with long standing leukopenia and a low titre ANA, multiple sites of arthralgias may indicate a predisposition for autoimmune disease. I suspect that she  ay have autoimmune thyroiditis, she may be in the hyperthyroid phase of the thyroiditis.  I will request records [facility] in [place] for review efore ordering additional testing. For today I will repeat CBC, will send  for flow cytometry for leukemia lymphoma panel. Repeat ANA.- I will repeat her thyroid function.   An ultrasound of the spleen and an ultrasound of the thyroid has been requested.  I will see her back next week.   Patient expressed understanding and was in agreement with this plan. She also understands that She can call clinic at any time with any questions, concerns, or complaints.  Orders Placed This Encounter  Procedures  . US Abdomen Limited    Standing Status:   Future    Number of Occurrences:   1    Standing Expiration Date:   01/15/2017    Order Specific Question:   Reason for Exam (SYMPTOM  OR DIAGNOSIS REQUIRED)    Answer:   lookign for splenomeglay, h/o leukocytopenia    Order Specific Question:   Preferred imaging location?    Answer:   [facility]  . US THYROID    Standing Status:   Future    Number of Occurrences:   1    Standing Expiration Date:   01/15/2017    Order Specific Question:   Reason for Exam (SYMPTOM  OR DIAGNOSIS REQUIRED)    Answer:   Enlarged and painful thyroid    Order Specific Question:    Preferred imaging location?    Answer:   [facility]  . Flow cytometry panel-leukemia/lymphoma work-up    Standing Status:   Future    Number of Occurrences:   1    Standing Expiration Date:   11/15/2016  . CBC with Differential    Standing Status:   Future    Number of Occurrences:   1    Standing Expiration Date:   11/15/2016  . Thyroid Panel With TSH  . Sedimentation rate  . ANA w/Reflex    Standing Status:   Future    Number of Occurrences:   1    Standing Expiration Date:   11/15/2016     Return in about 1 week (around 11/23/2015).  This note was generated in part with voice recognition software and I apologize for any typographical errors that were not detected and corrected.    Creola Corn, MD   11/25/2015 4:30 PM

## 2015-12-03 LAB — HM PAP SMEAR

## 2015-12-22 ENCOUNTER — Ambulatory Visit (INDEPENDENT_AMBULATORY_CARE_PROVIDER_SITE_OTHER): Payer: Managed Care, Other (non HMO) | Admitting: Family Medicine

## 2015-12-22 ENCOUNTER — Encounter: Payer: Self-pay | Admitting: Family Medicine

## 2015-12-22 VITALS — BP 121/79 | HR 75 | Temp 98.1°F | Resp 12 | Wt 164.1 lb

## 2015-12-22 DIAGNOSIS — R220 Localized swelling, mass and lump, head: Secondary | ICD-10-CM

## 2015-12-22 DIAGNOSIS — R221 Localized swelling, mass and lump, neck: Secondary | ICD-10-CM

## 2015-12-22 MED ORDER — DOXYCYCLINE HYCLATE 100 MG PO TABS: 100.0000 mg | ORAL_TABLET | Freq: Two times a day (BID) | ORAL | 0 refills | Status: DC

## 2015-12-22 NOTE — Assessment & Plan Note (Signed)
New problem. Suspect that patient had a bump/pimple which became secondarily infected. Treating with Doxy.

## 2015-12-22 NOTE — Progress Notes (Signed)
   Subjective:  Patient ID: Jenny Castillo, female    DOB: Jun 11, 1975  Age: 40 y.o. MRN: [id number]  CC: Rash, Swelling under jaw  HPI:  40 year old female presents with the above complaint.  Patient reports that on Sunday she noticed a bump on the left side of her nose. She subsequently pressed/mashed the area and it became worse over the following days. She reports associated pain. No drainage. No known exacerbating or relieving factors. No new exposures.  Additionally, she reports that she's had a lump with associated pain of her left lower jaw. She states that this is been slowly improving. No associated fevers or chills. No other complaints at this time.  Social Hx   Social History   Social History  . Marital status: Married    Spouse name: N/A  . Number of children: N/A  . Years of education: N/A   Social History Main Topics  . Smoking status: Former Games developer  . Smokeless tobacco: Never Used  . Alcohol use 0.0 oz/week  . Drug use: No  . Sexual activity: Yes   Other Topics Concern  . None   Social History Narrative  . None   Review of Systems  Musculoskeletal:       Pain under jaw.  Skin: Positive for rash.   Objective:  BP 121/79 (BP Location: Left Arm, Patient Position: Sitting, Cuff Size: Normal)   Pulse 75   Temp 98.1 F (36.7 C) (Oral)   Resp 12   Wt 164 lb 2 oz (74.4 kg)   SpO2 100%   BMI 28.39 kg/m   BP/Weight 12/22/2015 11/23/2015 11/02/2015  Systolic BP 121 119 120  Diastolic BP 79 78 80  Wt. (Lbs) 164.13 161.6 160  BMI 28.39 27.96 27.68   Physical Exam  Constitutional: She is oriented to person, place, and time. She appears well-developed. No distress.  HENT:  Mouth/Throat: Oropharynx is clear and moist.  Neck:  No discrete mass or lymph node palpated under the left jaw.  Pulmonary/Chest: Effort normal.  Neurological: She is alert and oriented to person, place, and time.  Skin:  Nose - left side of nosed with swelling, erythema. No drainage.    Psychiatric: She has a normal mood and affect.  Vitals reviewed.  Lab Results  Component Value Date   WBC 2.5 (L) 11/16/2015   HGB 14.1 11/16/2015   HCT 41.1 11/16/2015   PLT 162 11/16/2015   TSH 3.280 11/16/2015    Assessment & Plan:   Problem List Items Addressed This Visit    Swelling of nose    New problem. Suspect that patient had a bump/pimple which became secondarily infected. Treating with Doxy.      Submandibular swelling    New problem. Exam unremarkable today. Improving. Supportive care.        Meds ordered this encounter  Medications  . doxycycline (VIBRA-TABS) 100 MG tablet    Sig: Take 1 tablet (100 mg total) by mouth 2 (two) times daily.    Dispense:  20 tablet    Refill:  0    Follow-up: PRN  Everlene Other DO [facility]

## 2015-12-22 NOTE — Patient Instructions (Signed)
Antibiotic as prescribed.  Follow up if you fail to improve or worsen.  Take care  Dr. Adriana Simas

## 2015-12-22 NOTE — Assessment & Plan Note (Signed)
New problem. Exam unremarkable today. Improving. Supportive care.

## 2016-04-11 ENCOUNTER — Encounter: Payer: Self-pay | Admitting: Internal Medicine

## 2016-04-11 ENCOUNTER — Ambulatory Visit (INDEPENDENT_AMBULATORY_CARE_PROVIDER_SITE_OTHER): Payer: Commercial Managed Care - PPO | Admitting: Internal Medicine

## 2016-04-11 VITALS — BP 120/78 | HR 82 | Temp 98.0°F | Wt 166.5 lb

## 2016-04-11 DIAGNOSIS — R635 Abnormal weight gain: Secondary | ICD-10-CM

## 2016-04-11 DIAGNOSIS — D709 Neutropenia, unspecified: Secondary | ICD-10-CM

## 2016-04-11 DIAGNOSIS — M2550 Pain in unspecified joint: Secondary | ICD-10-CM | POA: Diagnosis not present

## 2016-04-11 LAB — CBC WITH DIFFERENTIAL/PLATELET
Basophils Absolute: 0 10*3/uL (ref 0.0–0.1)
Basophils Relative: 0.8 % (ref 0.0–3.0)
Eosinophils Absolute: 0.1 10*3/uL (ref 0.0–0.7)
Eosinophils Relative: 1.9 % (ref 0.0–5.0)
HCT: 40.5 % (ref 36.0–46.0)
Hemoglobin: 13.7 g/dL (ref 12.0–15.0)
Lymphocytes Relative: 25.4 % (ref 12.0–46.0)
Lymphs Abs: 0.8 10*3/uL (ref 0.7–4.0)
MCHC: 33.9 g/dL (ref 30.0–36.0)
MCV: 92.3 fl (ref 78.0–100.0)
Monocytes Absolute: 0.5 10*3/uL (ref 0.1–1.0)
Monocytes Relative: 15.2 % — ABNORMAL HIGH (ref 3.0–12.0)
Neutro Abs: 1.8 10*3/uL (ref 1.4–7.7)
Neutrophils Relative %: 56.7 % (ref 43.0–77.0)
Platelets: 205 10*3/uL (ref 150.0–400.0)
RBC: 4.39 Mil/uL (ref 3.87–5.11)
RDW: 12.5 % (ref 11.5–15.5)
WBC: 3.2 10*3/uL — ABNORMAL LOW (ref 4.0–10.5)

## 2016-04-11 MED ORDER — MELOXICAM 15 MG PO TABS: 15.0000 mg | ORAL_TABLET | Freq: Every day | ORAL | 2 refills | Status: DC

## 2016-04-11 NOTE — Patient Instructions (Signed)

## 2016-04-11 NOTE — Progress Notes (Signed)
Subjective:    Patient ID: Jenny Castillo, female    DOB: [birth year], 41 y.o.   MRN: [id number]  HPI  Pt presents to the clinic today requesting labs. She has a history of immature neutropenia. She saw hematology 10/2015. Because her counts have been stable for > 10 years, they advised her no further workup was needed, other than monitoring her blood counts every 6 months.  She is also concerned about weight gain. She reports she has gained 26 lbs in the last 2 years. Her diet and activity level has not changed. She is stressed. She has been on Phentermine and B12 injections in the past and is wondering if she could get started back on this.  She also wants to follow up on her multiple joint pain. She had a positive ANA here. She was referred to rheumatology and had a negative ANA there. She reports the rheumatologist told her she did not have any rheumatological disease. She continues to have intermittent joint pain but no swelling. She denies muscle pain. She takes Advil as needed with some relief.  Review of Systems      Past Medical History:  Diagnosis Date  . Abnormal white blood cell (WBC)    low  . Chronic fatigue   . Immune neutropenia ([facility])   . Thyroid nodule     Current Outpatient Prescriptions  Medication Sig Dispense Refill  . doxycycline (VIBRA-TABS) 100 MG tablet Take 1 tablet (100 mg total) by mouth 2 (two) times daily. 20 tablet 0   No current facility-administered medications for this visit.     Allergies  Allergen Reactions  . Ceftin [Cefuroxime Axetil] Other (See Comments)  . Reglan [Metoclopramide] Other (See Comments)    Heavy pressure in chest , can't breathe  . Septra [Sulfamethoxazole-Trimethoprim] Other (See Comments)    Has a low blood count has to stay away from certain medications   . Sulfa Antibiotics Other (See Comments)  . Rocephin [Ceftriaxone Sodium In Dextrose] Rash    Swelling     Family History  Problem Relation Age of Onset  . Cancer  Mother     kidney  . Stroke Father   . Hypertension Father   . Asthma Maternal Grandmother   . Hypertension Maternal Grandmother   . Stroke Paternal Grandmother     Social History   Social History  . Marital status: Married    Spouse name: N/A  . Number of children: N/A  . Years of education: N/A   Occupational History  . Not on file.   Social History Main Topics  . Smoking status: Former Games developer  . Smokeless tobacco: Never Used  . Alcohol use 0.0 oz/week  . Drug use: No  . Sexual activity: Yes   Other Topics Concern  . Not on file   Social History Narrative  . No narrative on file     Constitutional: Pt reports weight gain. Denies fever, malaise, fatigue, headache.  Respiratory: Denies difficulty breathing, shortness of breath, cough or sputum production.   Skin: Denies redness, rashes, lesions or ulcercations.  MSK: Pt reports joint pain. Denies difficulty with range of motion, joint swelling or difficulty with gait.   No other specific complaints in a complete review of systems (except as listed in HPI above).  Objective:   Physical Exam  BP 120/78   Pulse 82   Temp 98 F (36.7 C) (Oral)   Wt 166 lb 8 oz (75.5 kg)   SpO2 99%   BMI  28.80 kg/m  Wt Readings from Last 3 Encounters:  04/11/16 166 lb 8 oz (75.5 kg)  12/22/15 164 lb 2 oz (74.4 kg)  11/23/15 161 lb 9.6 oz (73.3 kg)    General: Appears her stated age, well developed, well nourished in NAD. Skin: Warm, dry and intact. No bruising noted. Cardiovascular: Normal rate and rhythm. S1,S2 noted.  No murmur, rubs or gallops noted. No JVD or BLE edema. No carotid bruits noted. Pulmonary/Chest: Normal effort and positive vesicular breath sounds.  Musculoskeletal:  No signs of joint swelling. No difficulty with gait.  Neurological: Alert and oriented.      Assessment & Plan:   Abnormal Weight Gain:  Discussed the importance of healthy diet and exercise Recommend Dr. Dalbert Garnet at [facility]  Immune Neutropenia:  CBC with Diff today  Multiple Joint Pain:  Start Meloxicam, eRx sent to pharmacy  RTC as needed or if symptoms persist or worsen Raford Brissett, NP

## 2016-08-21 ENCOUNTER — Other Ambulatory Visit: Payer: Self-pay | Admitting: Obstetrics and Gynecology

## 2016-08-21 DIAGNOSIS — Z1231 Encounter for screening mammogram for malignant neoplasm of breast: Secondary | ICD-10-CM

## 2016-08-29 ENCOUNTER — Ambulatory Visit (INDEPENDENT_AMBULATORY_CARE_PROVIDER_SITE_OTHER): Payer: Commercial Managed Care - PPO | Admitting: Obstetrics and Gynecology

## 2016-08-29 ENCOUNTER — Encounter: Payer: Self-pay | Admitting: Obstetrics and Gynecology

## 2016-08-29 VITALS — BP 122/70 | Wt 156.0 lb

## 2016-08-29 DIAGNOSIS — N63 Unspecified lump in unspecified breast: Secondary | ICD-10-CM

## 2016-08-29 NOTE — Progress Notes (Signed)
Obstetrics & Gynecology Office Visit   Chief Complaint  Patient presents with  . Breast Pain    History of Present Illness: 41 y.o. [phone] female who presents for recent-onset right breast lump located at edge of breast-areola junction at about 12 o'clock.  She was given a round of amoxicillin that she took for one week.  She had an area of erythema from about 12 o'clock to 3 o'clock on her right breast. The area seemed to improve, but there is still a "shadow-like" discoloration in the area.  She reports initial tenderness, which has improved. Nothing makes it worse.  No associated symptoms.  Denies nipple discharge, weight changes.  Denies personal history of breast cancer.  She has had a lymph node biopsy in her right axilla that was benign.   Past Medical History:  Diagnosis Date  . Abnormal white blood cell (WBC)    low; white blood cell disorder  . Chronic fatigue   . Galactorrhea   . 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 Thyroid nodule     Past Surgical History:  Procedure Laterality Date  . AXILLARY LYMPH NODE BIOPSY    . BONE MARROW BIOPSY    . CESAREAN SECTION  2004; 2010  . WISDOM TOOTH EXTRACTION      Gynecologic History: No LMP recorded. Patient is not currently having periods (Reason: IUD).  Obstetric History: [id number]  Family History  Problem Relation Age of Onset  . Cancer Mother 14       kidney-renal cell  . Stroke Father   . Hypertension Father   . Asthma Maternal Grandmother   . Hypertension Maternal Grandmother   . Stroke Paternal Grandmother     Social History   Social History  . Marital status: Married    Spouse name: N/A  . Number of children: 3  . Years of education: 14   Occupational History  . insurance    Social History Main Topics  . Smoking status: Former Research scientist (life sciences)  . Smokeless tobacco: Never Used  . Alcohol use 0.0 oz/week  . Drug use: No  . Sexual activity: Yes   Other Topics Concern  . Not on file   Social History Narrative  . No  narrative on file    Allergies  Allergen Reactions  . Ceftin [Cefuroxime Axetil] Other (See Comments)  . Reglan [Metoclopramide] Other (See Comments)    Heavy pressure in chest , can't breathe  . Septra [Sulfamethoxazole-Trimethoprim] Other (See Comments)    Has a low blood count has to stay away from certain medications   . Sulfa Antibiotics Other (See Comments)  . Rocephin [Ceftriaxone Sodium In Dextrose] Rash    Swelling       Medication Sig Start Date End Date Taking? Authorizing Provider  levonorgestrel (MIRENA) 20 MCG/24HR IUD 1 each by Intrauterine route once. Inserted 01/2016    [provider]  meloxicam (MOBIC) 15 MG tablet Take 1 tablet (15 mg total) by mouth daily. 04/11/16   Jearld Fenton, NP    Review of Systems  Constitutional: Negative.   HENT: Negative.   Eyes: Negative.   Respiratory: Negative.   Cardiovascular: Positive for chest pain (breast). Negative for palpitations, orthopnea, claudication, leg swelling and PND.  Gastrointestinal: Negative.   Genitourinary: Negative.   Musculoskeletal: Negative.   Skin:       See HPI  Neurological: Negative.   Endo/Heme/Allergies: Negative.   Psychiatric/Behavioral: Negative.      Physical Exam BP 122/70   Wt 156 lb (  70.8 kg)   BMI 26.78 kg/m  No LMP recorded. Patient is not currently having periods (Reason: IUD). Physical Exam  Constitutional: She is oriented to person, place, and time. She appears well-developed and well-nourished. No distress.  HENT:  Head: Normocephalic and atraumatic.  Eyes: Conjunctivae are normal. No scleral icterus.  Neck: Normal range of motion. Neck supple. No thyromegaly present.  Cardiovascular: Normal rate, regular rhythm and normal heart sounds.   Pulmonary/Chest: Effort normal and breath sounds normal. Right breast exhibits skin change (see image) and tenderness. Right breast exhibits no inverted nipple, no mass and no nipple discharge. Left breast exhibits no inverted  nipple, no mass, no nipple discharge, no skin change and no tenderness.    Lymphadenopathy:    She has no cervical adenopathy.  Neurological: She is alert and oriented to person, place, and time. No cranial nerve deficit.  Psychiatric: She has a normal mood and affect. Her behavior is normal. Judgment normal.   Female chaperone present for pelvic and breast  portions of the physical exam  Assessment: 41 y.o. [phone] female with a new-onset breast lump.    Plan: Problem List Items Addressed This Visit    Breast lump in upper inner quadrant - Primary   Relevant Orders   MM DIAG BREAST TOMO BILATERAL   US BREAST LTD UNI RIGHT INC AXILLA     Diagnostic mammogram, ultrasound to investigate.  Will get ASAP.   Prentice Docker, MD 08/30/2016 12:38 PM

## 2016-09-01 ENCOUNTER — Inpatient Hospital Stay
Admission: RE | Admit: 2016-09-01 | Discharge: 2016-09-01 | Disposition: A | Discharge status: Home / Self Care | Payer: Self-pay | Source: Ambulatory Visit | Attending: *Deleted | Admitting: *Deleted

## 2016-09-01 ENCOUNTER — Other Ambulatory Visit: Payer: Self-pay | Admitting: Obstetrics and Gynecology

## 2016-09-01 ENCOUNTER — Other Ambulatory Visit: Payer: Self-pay | Admitting: *Deleted

## 2016-09-01 DIAGNOSIS — Z9289 Personal history of other medical treatment: Secondary | ICD-10-CM

## 2016-09-01 DIAGNOSIS — N6310 Unspecified lump in the right breast, unspecified quadrant: Secondary | ICD-10-CM

## 2016-09-01 DIAGNOSIS — N63 Unspecified lump in unspecified breast: Secondary | ICD-10-CM

## 2016-09-04 ENCOUNTER — Ambulatory Visit: Payer: Self-pay | Admitting: Obstetrics and Gynecology

## 2016-09-04 ENCOUNTER — Telehealth: Payer: Self-pay

## 2016-09-04 NOTE — Telephone Encounter (Signed)
Pt states SDJ referred her to get a diagnotic breast u/s. The first available appt is on Monday 8/20. Pt would like to know if it is okay to wait until then or if she should try to get an appt elsewhere for an earlier date. Cb# [phone] thank you

## 2016-09-05 NOTE — Telephone Encounter (Signed)
Pt aware that this  Should be okay to wait until then but to call me back if she has more problems

## 2016-09-11 ENCOUNTER — Ambulatory Visit
Admission: RE | Admit: 2016-09-11 | Discharge: 2016-09-11 | Disposition: A | Discharge status: Home / Self Care | Payer: Commercial Managed Care - PPO | Source: Ambulatory Visit | Attending: Obstetrics and Gynecology | Admitting: Obstetrics and Gynecology

## 2016-09-11 DIAGNOSIS — N6310 Unspecified lump in the right breast, unspecified quadrant: Secondary | ICD-10-CM | POA: Insufficient documentation

## 2016-12-12 ENCOUNTER — Encounter: Payer: Self-pay | Admitting: Internal Medicine

## 2016-12-12 ENCOUNTER — Ambulatory Visit (INDEPENDENT_AMBULATORY_CARE_PROVIDER_SITE_OTHER): Payer: Commercial Managed Care - PPO | Admitting: Internal Medicine

## 2016-12-12 VITALS — BP 114/68 | HR 68 | Temp 98.0°F | Ht 63.75 in | Wt 152.0 lb

## 2016-12-12 DIAGNOSIS — M2550 Pain in unspecified joint: Secondary | ICD-10-CM | POA: Diagnosis not present

## 2016-12-12 DIAGNOSIS — M722 Plantar fascial fibromatosis: Secondary | ICD-10-CM | POA: Diagnosis not present

## 2016-12-12 DIAGNOSIS — Z0000 Encounter for general adult medical examination without abnormal findings: Secondary | ICD-10-CM | POA: Diagnosis not present

## 2016-12-12 DIAGNOSIS — J302 Other seasonal allergic rhinitis: Secondary | ICD-10-CM

## 2016-12-12 DIAGNOSIS — E875 Hyperkalemia: Secondary | ICD-10-CM | POA: Diagnosis not present

## 2016-12-12 DIAGNOSIS — D709 Neutropenia, unspecified: Secondary | ICD-10-CM | POA: Diagnosis not present

## 2016-12-12 DIAGNOSIS — Z13220 Encounter for screening for lipoid disorders: Secondary | ICD-10-CM | POA: Diagnosis not present

## 2016-12-12 DIAGNOSIS — J069 Acute upper respiratory infection, unspecified: Secondary | ICD-10-CM | POA: Diagnosis not present

## 2016-12-12 DIAGNOSIS — M459 Ankylosing spondylitis of unspecified sites in spine: Secondary | ICD-10-CM | POA: Insufficient documentation

## 2016-12-12 LAB — LIPID PANEL
Cholesterol: 131 mg/dL (ref 0–200)
HDL: 43.9 mg/dL (ref >39.00)
LDL Cholesterol: 73 mg/dL (ref 0–99)
NonHDL: 87.05
Total CHOL/HDL Ratio: 3
Triglycerides: 72 mg/dL (ref 0.0–149.0)
VLDL: 14.4 mg/dL (ref 0.0–40.0)

## 2016-12-12 LAB — COMPREHENSIVE METABOLIC PANEL
ALT: 16 U/L (ref 0–35)
AST: 19 U/L (ref 0–37)
Albumin: 4.2 g/dL (ref 3.5–5.2)
Alkaline Phosphatase: 41 U/L (ref 39–117)
BUN: 8 mg/dL (ref 6–23)
CO2: 29 mEq/L (ref 19–32)
Calcium: 9.6 mg/dL (ref 8.4–10.5)
Chloride: 103 mEq/L (ref 96–112)
Creatinine, Ser: 0.74 mg/dL (ref 0.40–1.20)
GFR: 91.02 mL/min (ref >60.00)
Glucose, Bld: 90 mg/dL (ref 70–99)
Potassium: 5.3 mEq/L — ABNORMAL HIGH (ref 3.5–5.1)
Sodium: 139 mEq/L (ref 135–145)
Total Bilirubin: 0.5 mg/dL (ref 0.2–1.2)
Total Protein: 7.2 g/dL (ref 6.0–8.3)

## 2016-12-12 LAB — CBC WITH DIFFERENTIAL/PLATELET
Basophils Absolute: 0 10*3/uL (ref 0.0–0.1)
Basophils Relative: 0.6 % (ref 0.0–3.0)
Eosinophils Absolute: 0.1 10*3/uL (ref 0.0–0.7)
Eosinophils Relative: 2.3 % (ref 0.0–5.0)
HCT: 40.8 % (ref 36.0–46.0)
Hemoglobin: 13.5 g/dL (ref 12.0–15.0)
Lymphocytes Relative: 23.2 % (ref 12.0–46.0)
Lymphs Abs: 1 10*3/uL (ref 0.7–4.0)
MCHC: 33 g/dL (ref 30.0–36.0)
MCV: 94.5 fl (ref 78.0–100.0)
Monocytes Absolute: 0.8 10*3/uL (ref 0.1–1.0)
Monocytes Relative: 17.4 % — ABNORMAL HIGH (ref 3.0–12.0)
Neutro Abs: 2.5 10*3/uL (ref 1.4–7.7)
Neutrophils Relative %: 56.5 % (ref 43.0–77.0)
Platelets: 188 10*3/uL (ref 150.0–400.0)
RBC: 4.32 Mil/uL (ref 3.87–5.11)
RDW: 12.2 % (ref 11.5–15.5)
WBC: 4.5 10*3/uL (ref 4.0–10.5)

## 2016-12-12 NOTE — Assessment & Plan Note (Signed)
Negative workup Continue Meloxicam prn CMET today

## 2016-12-12 NOTE — Assessment & Plan Note (Addendum)
Continue Meloxicam as needed CMET today If worse, consider referral to podiatry for further evaluation

## 2016-12-12 NOTE — Patient Instructions (Signed)

## 2016-12-12 NOTE — Progress Notes (Signed)
Subjective:    Patient ID: Jenny Castillo, female    DOB: [birth year], 41 y.o.   MRN: [id number]  HPI  Pt presents to the clinic today for her annual exam. She is also due to follow up chronic conditions.  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 She denies any current issues at this time.   Plantar Fasciitis: Right seems worse than left. She takes Meloxicam as needed with some relief.  Polyarthralgias: Mainly in her hands, elbow, and knees. She takes Meloxicam as needed with some relief. She has had a negative autoimmune workup by rheumatology.  She reports nasal congestion and cough. She reports this started 2 weeks ago. She is not blowing anything out of her nose. The cough is productive of clear/yellow mucous. She denies runny nose, ear pain, sore throat or shortness of breath. She denies fever, chills or body aches. She has taken Mucinex D, Tylenol Cold and Flu as needed with minimal relief. She has had sick contacts.  Flu: 10/2016 Tetanus: > 10 years ago Pap Smear: 11/2015 Mammogram: 08/2016 Vision Screening: as needed Dentist: biannually  Diet: She does eat meat. She consumes fruits and veggies. She does eat fried foods. She drinks mostly water, sweet tea and coffee. Exercise: She walks for at least 30 minutes a few days per week  Review of Systems      Past Medical History:  Diagnosis Date  . Abnormal white blood cell (WBC)    low; white blood cell disorder  . Chronic fatigue   . Galactorrhea   . Immune neutropenia (HCC)   . Thyroid nodule     Current Outpatient Medications  Medication Sig Dispense Refill  . levonorgestrel (MIRENA) 20 MCG/24HR IUD 1 each by Intrauterine route once. Inserted 01/2016    . meloxicam (MOBIC) 15 MG tablet Take 1 tablet (15 mg total) by mouth daily. 30 tablet 2   No current facility-administered medications for this visit.     Allergies  Allergen Reactions  . Ceftin [Cefuroxime Axetil] Other (See  Comments)  . Reglan [Metoclopramide] Other (See Comments)    Heavy pressure in chest , can't breathe  . Septra [Sulfamethoxazole-Trimethoprim] Other (See Comments)    Has a low blood count has to stay away from certain medications   . Sulfa Antibiotics Other (See Comments)  . Rocephin [Ceftriaxone Sodium In Dextrose] Rash    Swelling     Family History  Problem Relation Age of Onset  . Cancer Mother 71       kidney-renal cell  . Stroke Father   . Hypertension Father   . Asthma Maternal Grandmother   . Hypertension Maternal Grandmother   . Stroke Paternal Grandmother     Social History   Socioeconomic History  . Marital status: Married    Spouse name: Not on file  . Number of children: 3  . Years of education: 69  . Highest education level: Not on file  Social Needs  . Financial resource strain: Not on file  . Food insecurity - worry: Not on file  . Food insecurity - inability: Not on file  . Transportation needs - medical: Not on file  . Transportation needs - non-medical: Not on file  Occupational History  . Occupation: insurance  Tobacco Use  . Smoking status: Former Games developer  . Smokeless tobacco: Never Used  Substance and Sexual Activity  . Alcohol use: Yes    Alcohol/week: 0.0 oz  . Drug use: No  .  Sexual activity: Yes  Other Topics Concern  . Not on file  Social History Narrative  . Not on file     Constitutional: Denies fever, malaise, fatigue, headache or abrupt weight changes.  HEENT: Pt reports nasal congestion. Denies eye pain, eye redness, ear pain, ringing in the ears, wax buildup, runny nose, bloody nose, or sore throat. Respiratory: Pt reports cough. Denies difficulty breathing, shortness of breath, or sputum production.   Cardiovascular: Denies chest pain, chest tightness, palpitations or swelling in the hands or feet.  Gastrointestinal: Denies abdominal pain, bloating, constipation, diarrhea or blood in the stool.  GU: Denies urgency, frequency,  pain with urination, burning sensation, blood in urine, odor or discharge. Musculoskeletal: Pt reports intermittent joint pains. Denies decrease in range of motion, difficulty with gait, muscle pain or joint swelling.  Skin: Denies redness, rashes, lesions or ulcercations.  Neurological: Denies dizziness, difficulty with memory, difficulty with speech or problems with balance and coordination.  Psych: Denies anxiety, depression, SI/HI.  No other specific complaints in a complete review of systems (except as listed in HPI above).  Objective:   Physical Exam  BP 114/68   Pulse 68   Temp 98 F (36.7 C) (Oral)   Ht 5' 3.75" (1.619 m)   Wt 152 lb (68.9 kg)   SpO2 98%   BMI 26.30 kg/m  Wt Readings from Last 3 Encounters:  12/12/16 152 lb (68.9 kg)  08/29/16 156 lb (70.8 kg)  12/03/15 161 lb (73 kg)    General: Appears her stated age, well developed, well nourished in NAD. Skin: Warm, dry and intact.  HEENT: Head: normal shape and size, no sinus tenderness noted; Eyes: sclera white, no icterus, conjunctiva pink, PERRLA and EOMs intact; Ears: Tm's gray and intact, normal light reflex; Nose: mucosa pink and moist, septum midline; Throat/Mouth: Teeth present, mucosa pink and moist, + PND, no exudate, lesions or ulcerations noted.  Neck:  Neck supple, trachea midline. No masses, lumps or thyromegaly present.  Cardiovascular: Normal rate and rhythm. S1,S2 noted.  No murmur, rubs or gallops noted. No JVD or BLE edema. No carotid bruits noted. Pulmonary/Chest: Normal effort and positive vesicular breath sounds. No respiratory distress. No wheezes, rales or ronchi noted.  Abdomen: Soft and nontender. Normal bowel sounds. No distention or masses noted. Liver, spleen and kidneys non palpable. Musculoskeletal: Strength 5/5 BUE/BLE. No difficulty with gait.  Neurological: Alert and oriented. Cranial nerves II-XII grossly intact. Coordination normal.  Psychiatric: Mood and affect normal. Behavior is  normal. Judgment and thought content normal.    BMET No results found for: NA, K, CL, CO2, GLUCOSE, BUN, CREATININE, CALCIUM, GFRNONAA, GFRAA  Lipid Panel  No results found for: CHOL, TRIG, HDL, CHOLHDL, VLDL, LDLCALC  CBC    Component Value Date/Time   WBC 3.2 (L) 04/11/2016 1332   RBC 4.39 04/11/2016 1332   HGB 13.7 04/11/2016 1332   HCT 40.5 04/11/2016 1332   PLT 205.0 04/11/2016 1332   MCV 92.3 04/11/2016 1332   MCH 31.3 11/16/2015 1505   MCHC 33.9 04/11/2016 1332   RDW 12.5 04/11/2016 1332   LYMPHSABS 0.8 04/11/2016 1332   MONOABS 0.5 04/11/2016 1332   EOSABS 0.1 04/11/2016 1332   BASOSABS 0.0 04/11/2016 1332    Hgb A1C No results found for: HGBA1C          Assessment & Plan:   Preventative Health Maintenance:  Flu shot UTD She declines tetanus booster Pap smear and mammogram UTD Encouraged her to consume a balanced  diet and exercise regimen. Advised her to see an eye doctor and dentist annually Will check CBC, CMET and Lipid profile today  Viral URI/Allergies:  Advised her to try Zyrtec and Flonase OTC Delsym or Robitussin for cough No indication for abx at this time  RTC in 1 year, sooner if needed Nicki Reaper, NP

## 2016-12-12 NOTE — Assessment & Plan Note (Signed)
CBC today.  

## 2016-12-19 NOTE — Addendum Note (Signed)
Addended by: Roena Malady on: 12/19/2016 05:06 PM   Modules accepted: Orders

## 2016-12-20 ENCOUNTER — Encounter: Payer: Self-pay | Admitting: Obstetrics and Gynecology

## 2016-12-20 ENCOUNTER — Other Ambulatory Visit: Payer: Self-pay

## 2016-12-20 ENCOUNTER — Other Ambulatory Visit (INDEPENDENT_AMBULATORY_CARE_PROVIDER_SITE_OTHER): Payer: Commercial Managed Care - PPO

## 2016-12-20 ENCOUNTER — Telehealth: Payer: Self-pay

## 2016-12-20 ENCOUNTER — Ambulatory Visit (INDEPENDENT_AMBULATORY_CARE_PROVIDER_SITE_OTHER): Payer: Commercial Managed Care - PPO | Admitting: Obstetrics and Gynecology

## 2016-12-20 ENCOUNTER — Encounter: Payer: Self-pay | Admitting: Internal Medicine

## 2016-12-20 VITALS — BP 118/78 | HR 76 | Ht 64.0 in | Wt 153.0 lb

## 2016-12-20 DIAGNOSIS — Z01419 Encounter for gynecological examination (general) (routine) without abnormal findings: Secondary | ICD-10-CM | POA: Diagnosis not present

## 2016-12-20 DIAGNOSIS — Z30431 Encounter for routine checking of intrauterine contraceptive device: Secondary | ICD-10-CM | POA: Diagnosis not present

## 2016-12-20 DIAGNOSIS — Z1231 Encounter for screening mammogram for malignant neoplasm of breast: Secondary | ICD-10-CM | POA: Diagnosis not present

## 2016-12-20 DIAGNOSIS — R7309 Other abnormal glucose: Secondary | ICD-10-CM

## 2016-12-20 DIAGNOSIS — E875 Hyperkalemia: Secondary | ICD-10-CM | POA: Diagnosis not present

## 2016-12-20 DIAGNOSIS — Z1239 Encounter for other screening for malignant neoplasm of breast: Secondary | ICD-10-CM

## 2016-12-20 NOTE — Telephone Encounter (Signed)
A1C added

## 2016-12-20 NOTE — Telephone Encounter (Signed)
Copied from CRM [id number]. Topic: Appointment Scheduling - Scheduling Inquiry for Clinic >> Dec 20, 2016  1:26 PM Yvonna Alanis wrote: Reason for CRM: Patient wants to add an A1C test to her lab today. Her Lab is at 4:00pm. Please call this patient ASAP Today. Thank You!!!

## 2016-12-20 NOTE — Patient Instructions (Signed)
I value your feedback and entrusting us with your care. If you get a  patient survey, I would appreciate you taking the time to let us know about your experience today. Thank you! 

## 2016-12-20 NOTE — Addendum Note (Signed)
Addended by: Lorre Munroe on: 12/20/2016 02:27 PM   Modules accepted: Orders

## 2016-12-20 NOTE — Progress Notes (Signed)
PCP:  Jearld Fenton, NP   Chief Complaint  Patient presents with  . Gynecologic Exam    No Complaints; F/U Breast issue      HPI:      Ms. Jenny Castillo is a 41 y.o. [phone] who LMP was No LMP recorded. Patient is not currently having periods (Reason: IUD)., presents today for her annual examination.  Her menses are absent with IUD.  Dysmenorrhea none. She does not have intermenstrual bleeding.  Sex activity: single partner, contraception - IUD. Mirena placed 12/03/15 Last Pap: December 03, 2015  Results were: no abnormalities /neg HPV DNA  Hx of STDs: none  Last mammogram: September 11, 2016  Results were: normal--routine follow-up in 12 months. S/p RT breast erythema/infection 8/18. Sx resolved. There is no FH of breast cancer. There is no FH of ovarian cancer. The patient does not do self-breast exams.  Tobacco use: The patient denies current or previous tobacco use. Alcohol use: social drinker No drug use.  Exercise: moderately active  She does get adequate calcium and Vitamin D in her diet.  Labs with PCP.    Past Medical History:  Diagnosis Date  . Abnormal white blood cell (WBC)    low; white blood cell disorder  . Chronic fatigue   . Galactorrhea   . Immune neutropenia ([facility])   . Thyroid nodule     Past Surgical History:  Procedure Laterality Date  . AXILLARY LYMPH NODE BIOPSY Right 2001   benign mass  . BONE MARROW BIOPSY    . CESAREAN SECTION  2004; 2010  . WISDOM TOOTH EXTRACTION      Family History  Problem Relation Age of Onset  . Cancer Mother 17       kidney-renal cell  . Stroke Father   . Hypertension Father   . Asthma Maternal Grandmother   . Hypertension Maternal Grandmother   . Stroke Paternal Grandmother     Social History   Socioeconomic History  . Marital status: Married    Spouse name: Not on file  . Number of children: 3  . Years of education: 68  . Highest education level: Not on file  Social Needs  . Financial resource  strain: Not on file  . Food insecurity - worry: Not on file  . Food insecurity - inability: Not on file  . Transportation needs - medical: Not on file  . Transportation needs - non-medical: Not on file  Occupational History  . Occupation: insurance  Tobacco Use  . Smoking status: Former Research scientist (life sciences)  . Smokeless tobacco: Never Used  Substance and Sexual Activity  . Alcohol use: Yes    Alcohol/week: 0.0 oz  . Drug use: No  . Sexual activity: Yes  Other Topics Concern  . Not on file  Social History Narrative  . Not on file    Current Meds  Medication Sig  . levonorgestrel (MIRENA) 20 MCG/24HR IUD 1 each by Intrauterine route once. Inserted 01/2016  . meloxicam (MOBIC) 15 MG tablet Take 1 tablet (15 mg total) by mouth daily.     ROS:  Review of Systems  Constitutional: Negative for fatigue, fever and unexpected weight change.  Respiratory: Negative for cough, shortness of breath and wheezing.   Cardiovascular: Negative for chest pain, palpitations and leg swelling.  Gastrointestinal: Negative for blood in stool, constipation, diarrhea, nausea and vomiting.  Endocrine: Negative for cold intolerance, heat intolerance and polyuria.  Genitourinary: Negative for dyspareunia, dysuria, flank pain, frequency, genital sores, hematuria, menstrual  problem, pelvic pain, urgency, vaginal bleeding, vaginal discharge and vaginal pain.  Musculoskeletal: Negative for back pain, joint swelling and myalgias.  Skin: Negative for rash.  Neurological: Negative for dizziness, syncope, light-headedness, numbness and headaches.  Hematological: Negative for adenopathy.  Psychiatric/Behavioral: Negative for agitation, confusion, sleep disturbance and suicidal ideas. The patient is not nervous/anxious.      Objective: BP 118/78 (BP Location: Left Arm, Patient Position: Sitting, Cuff Size: Normal)   Pulse 76   Ht _0  (1.626 m)   Wt 153 lb (69.4 kg)   BMI 26.26 kg/m    Physical Exam  Constitutional:  She is oriented to person, place, and time. She appears well-developed and well-nourished.  Genitourinary: Vagina normal and uterus normal. There is no rash or tenderness on the right labia. There is no rash or tenderness on the left labia. No erythema or tenderness in the vagina. No vaginal discharge found. Right adnexum does not display mass and does not display tenderness. Left adnexum does not display mass and does not display tenderness.  Cervix exhibits visible IUD strings. Cervix does not exhibit motion tenderness or polyp. Uterus is not enlarged or tender.  Neck: Normal range of motion. No thyromegaly present.  Cardiovascular: Normal rate, regular rhythm and normal heart sounds.  No murmur heard. Pulmonary/Chest: Effort normal and breath sounds normal. Right breast exhibits no mass, no nipple discharge, no skin change and no tenderness. Left breast exhibits no mass, no nipple discharge, no skin change and no tenderness.  Abdominal: Soft. There is no tenderness. There is no guarding.  Musculoskeletal: Normal range of motion.  Neurological: She is alert and oriented to person, place, and time. No cranial nerve deficit.  Psychiatric: She has a normal mood and affect. Her behavior is normal.  Vitals reviewed.   Assessment/Plan: Encounter for annual routine gynecological examination  Encounter for routine checking of intrauterine contraceptive device (IUD) - IUD in place. Due for rem 10/2020.  Screening for breast cancer - Pt current on mammo. Due again 8/19. - Plan: MM DIGITAL SCREENING BILATERAL  GYN counsel mammography screening, adequate intake of calcium and vitamin D, diet and exercise     F/U  Return in about 1 year (around 12/20/2017).  Lynnzie Blackson B. Candita Borenstein, PA-C 12/20/2016 10:48 AM

## 2016-12-20 NOTE — Telephone Encounter (Signed)
I do not see where pt has previously had an A1C and do not see diabetes on problem list.Please advise.

## 2016-12-21 LAB — HEMOGLOBIN A1C: Hgb A1c MFr Bld: 5.7 % (ref 4.6–6.5)

## 2016-12-21 LAB — POTASSIUM: Potassium: 4.3 mEq/L (ref 3.5–5.1)

## 2017-09-17 ENCOUNTER — Ambulatory Visit (INDEPENDENT_AMBULATORY_CARE_PROVIDER_SITE_OTHER)
Admission: RE | Admit: 2017-09-17 | Discharge: 2017-09-17 | Disposition: A | Discharge status: Home / Self Care | Payer: Commercial Managed Care - PPO | Source: Ambulatory Visit | Attending: Internal Medicine | Admitting: Internal Medicine

## 2017-09-17 ENCOUNTER — Encounter: Payer: Self-pay | Admitting: Internal Medicine

## 2017-09-17 ENCOUNTER — Ambulatory Visit (INDEPENDENT_AMBULATORY_CARE_PROVIDER_SITE_OTHER): Payer: Commercial Managed Care - PPO | Admitting: Internal Medicine

## 2017-09-17 VITALS — BP 116/74 | HR 75 | Temp 98.0°F | Wt 163.0 lb

## 2017-09-17 DIAGNOSIS — M2550 Pain in unspecified joint: Secondary | ICD-10-CM

## 2017-09-17 NOTE — Patient Instructions (Signed)
Joint Pain Joint pain can be caused by many things. The joint can be bruised, infected, weak from aging, or sore from exercise. The pain will probably go away if you follow your doctor's instructions for home care. If your joint pain continues, more tests may be needed to help find the cause of your condition. Follow these instructions at home: Watch your condition for any changes. Follow these instructions as told to lessen the pain that you are feeling:  Take medicines only as told by your doctor.  Rest the sore joint for as long as told by your doctor. If your doctor tells you to, raise (elevate) the painful joint above the level of your heart while you are sitting or lying down.  Do not do things that cause pain or make the pain worse.  If told, put ice on the painful area: ? Put ice in a plastic bag. ? Place a towel between your skin and the bag. ? Leave the ice on for 20 minutes, 2-3 times per day.  Wear an elastic bandage, splint, or sling as told by your doctor. Loosen the bandage or splint if your fingers or toes lose feeling (become numb) and tingle, or if they turn cold and blue.  Begin exercising or stretching the joint as told by your doctor. Ask your doctor what types of exercise are safe for you.  Keep all follow-up visits as told by your doctor. This is important.  Contact a doctor if:  Your pain gets worse and medicine does not help it.  Your joint pain does not get better in 3 days.  You have more bruising or swelling.  You have a fever.  You lose 10 pounds (4.5 kg) or more without trying. Get help right away if:  You are not able to move the joint.  Your fingers or toes become numb or they turn cold and blue. This information is not intended to replace advice given to you by your health care provider. Make sure you discuss any questions you have with your health care provider. Document Released: 12/28/2008 Document Revised: 06/17/2015 Document Reviewed:  10/21/2013 Elsevier Interactive Patient Education  2018 Elsevier Inc.  

## 2017-09-17 NOTE — Assessment & Plan Note (Signed)
Persistent Prior negative autoimmune workup Will xray bilateral hands, lumbar spine, bilateral hips, knees and feet today Continue Ibuprofen Will refer to Dr. Nickola Major for second opinion

## 2017-09-17 NOTE — Progress Notes (Signed)
Subjective:    Patient ID: Jenny Castillo, female    DOB: July 21, 1975, 42 y.o.   MRN: [id number]  HPI  Pt presents to the clinic today to follow up polyarthralgia. This has been going on for at least 3 years, but seems to be getting worse. The major joints affected are her elbows, hands, lower back, bilateral hips, bilateral knees and feet. She reports they feel stiff all the time. She does not notice any swelling, but her pain seems to be getting worse. She has difficulty going from a sitting to a standing position. She has taken Meloxicam with minimal relief. She does take Ibuprofen 600 mg daily which seems to provide her with the most relief. She has had a positive ANA in the past, referred to Dr. Tresa Moore for further evaluation. Her autoimmune workup was negative. Pt is requesting additional workup due to increasing pain. She does take a MVI OTC. She is not taking any Glucosamine-Chondroitin.  Review of Systems  Past Medical History:  Diagnosis Date  . Abnormal white blood cell (WBC)    low; white blood cell disorder  . Chronic fatigue   . Galactorrhea   . Immune neutropenia (HCC)   . Thyroid nodule     Current Outpatient Medications  Medication Sig Dispense Refill  . ibuprofen (ADVIL,MOTRIN) 200 MG tablet Take 600 mg by mouth every morning.    [name] levonorgestrel (MIRENA) 20 MCG/24HR IUD 1 each by Intrauterine route once. Inserted 01/2016    . meloxicam (MOBIC) 15 MG tablet Take 1 tablet (15 mg total) by mouth daily. (Patient not taking: Reported on 09/17/2017) 30 tablet 2   No current facility-administered medications for this visit.     Allergies  Allergen Reactions  . Bactrim [Sulfamethoxazole-Trimethoprim] Other (See Comments)    Has a low blood count has to stay away from certain medications   . Ceftin [Cefuroxime Axetil] Other (See Comments)  . Reglan [Metoclopramide] Other (See Comments)    Heavy pressure in chest , can't breathe  . Sulfa Antibiotics Other (See Comments)    . Rocephin [Ceftriaxone Sodium In Dextrose] Rash    Swelling     Family History  Problem Relation Age of Onset  . Cancer Mother 64       kidney-renal cell  . Stroke Father   . Hypertension Father   . Asthma Maternal Grandmother   . Hypertension Maternal Grandmother   . Stroke Paternal Grandmother     Social History   Socioeconomic History  . Marital status: Married    Spouse name: Not on file  . Number of children: 3  . Years of education: 64  . Highest education level: Not on file  Occupational History  . Occupation: Sales executive  . Financial resource strain: Not on file  . Food insecurity:    Worry: Not on file    Inability: Not on file  . Transportation needs:    Medical: Not on file    Non-medical: Not on file  Tobacco Use  . Smoking status: Former Games developer  . Smokeless tobacco: Never Used  Substance and Sexual Activity  . Alcohol use: Yes    Alcohol/week: 0.0 standard drinks  . Drug use: No  . Sexual activity: Yes  Lifestyle  . Physical activity:    Days per week: 2 days    Minutes per session: 30 min  . Stress: Not at all  Relationships  . Social connections:    Talks on phone: More than three times  a week    Gets together: Twice a week    Attends religious service: More than 4 times per year    Active member of club or organization: Yes    Attends meetings of clubs or organizations: 1 to 4 times per year    Relationship status: Married  . Intimate partner violence:    Fear of current or ex partner: No    Emotionally abused: No    Physically abused: No    Forced sexual activity: No  Other Topics Concern  . Not on file  Social History Narrative  . Not on file     Constitutional: Denies fever, malaise, fatigue, headache or abrupt weight changes.  Respiratory: Denies difficulty breathing, shortness of breath, cough or sputum production.   Cardiovascular: Denies chest pain, chest tightness, palpitations or swelling in the hands or feet.   Musculoskeletal: Pt reports joint pain. Denies decrease in range of motion, difficulty with gait, muscle pain or joint swelling.  Skin: Denies redness, rashes, lesions or ulcercations.   No other specific complaints in a complete review of systems (except as listed in HPI above).     Objective:   Physical Exam   BP 116/74   Pulse 75   Temp 98 F (36.7 C) (Oral)   Wt 163 lb (73.9 kg)   SpO2 98%   BMI 27.98 kg/m  Wt Readings from Last 3 Encounters:  09/17/17 163 lb (73.9 kg)  12/20/16 153 lb (69.4 kg)  12/12/16 152 lb (68.9 kg)    General: Appears her stated age, in NAD. Skin: Warm, dry and intact. No rashes noted. Musculoskeletal: Normal flexion and extension of bilateral elbows. Normal flexion, extension and rotation of bilateral wrist. Normal flexion and extension of bilateral fingers. Normal flexion, rotation and lateral bending of the spine. Pain with extension of the spine. Bony tenderness noted over the lumbar spine. No pain with palpation of the bilateral paralumbar muscles. Normal flexion of knees. Pain with full extension of bilateral knees. Normal flexion, extension and rotation of bilateral ankles. No joint swelling noted. She has stiffness with moving from sitting to standing position. Gait slow but steady. She is able to walk on toes and heels.  Neurological: Alert and oriented. Sensation intact to BLE.   BMET    Component Value Date/Time   NA 139 12/12/2016 1004   K 4.3 12/20/2016 1733   CL 103 12/12/2016 1004   CO2 29 12/12/2016 1004   GLUCOSE 90 12/12/2016 1004   BUN 8 12/12/2016 1004   CREATININE 0.74 12/12/2016 1004   CALCIUM 9.6 12/12/2016 1004    Lipid Panel     Component Value Date/Time   CHOL 131 12/12/2016 1004   TRIG 72.0 12/12/2016 1004   HDL 43.90 12/12/2016 1004   CHOLHDL 3 12/12/2016 1004   VLDL 14.4 12/12/2016 1004   LDLCALC 73 12/12/2016 1004    CBC    Component Value Date/Time   WBC 4.5 12/12/2016 1004   RBC 4.32 12/12/2016  1004   HGB 13.5 12/12/2016 1004   HCT 40.8 12/12/2016 1004   PLT 188.0 12/12/2016 1004   MCV 94.5 12/12/2016 1004   MCH 31.3 11/16/2015 1505   MCHC 33.0 12/12/2016 1004   RDW 12.2 12/12/2016 1004   LYMPHSABS 1.0 12/12/2016 1004   MONOABS 0.8 12/12/2016 1004   EOSABS 0.1 12/12/2016 1004   BASOSABS 0.0 12/12/2016 1004    Hgb A1C Lab Results  Component Value Date   HGBA1C 5.7 12/20/2016  Assessment & Plan:

## 2017-11-05 ENCOUNTER — Other Ambulatory Visit: Payer: Self-pay | Admitting: Obstetrics and Gynecology

## 2017-11-05 DIAGNOSIS — Z1231 Encounter for screening mammogram for malignant neoplasm of breast: Secondary | ICD-10-CM

## 2017-11-30 ENCOUNTER — Ambulatory Visit
Admission: RE | Admit: 2017-11-30 | Discharge: 2017-11-30 | Disposition: A | Discharge status: Home / Self Care | Payer: Commercial Managed Care - PPO | Source: Ambulatory Visit | Attending: Obstetrics and Gynecology | Admitting: Obstetrics and Gynecology

## 2017-11-30 DIAGNOSIS — Z1231 Encounter for screening mammogram for malignant neoplasm of breast: Secondary | ICD-10-CM

## 2017-12-04 ENCOUNTER — Other Ambulatory Visit: Payer: Self-pay | Admitting: Obstetrics and Gynecology

## 2017-12-04 DIAGNOSIS — N6310 Unspecified lump in the right breast, unspecified quadrant: Secondary | ICD-10-CM

## 2017-12-04 DIAGNOSIS — R928 Other abnormal and inconclusive findings on diagnostic imaging of breast: Secondary | ICD-10-CM

## 2017-12-05 ENCOUNTER — Other Ambulatory Visit: Payer: Self-pay | Admitting: Obstetrics and Gynecology

## 2017-12-05 ENCOUNTER — Telehealth: Payer: Self-pay | Admitting: Obstetrics and Gynecology

## 2017-12-05 ENCOUNTER — Ambulatory Visit
Admission: RE | Admit: 2017-12-05 | Discharge: 2017-12-05 | Disposition: A | Discharge status: Home / Self Care | Payer: Commercial Managed Care - PPO | Source: Ambulatory Visit | Attending: Obstetrics and Gynecology | Admitting: Obstetrics and Gynecology

## 2017-12-05 DIAGNOSIS — R928 Other abnormal and inconclusive findings on diagnostic imaging of breast: Secondary | ICD-10-CM

## 2017-12-05 DIAGNOSIS — N6310 Unspecified lump in the right breast, unspecified quadrant: Secondary | ICD-10-CM | POA: Diagnosis present

## 2017-12-05 NOTE — Telephone Encounter (Signed)
Discussed Cat 4 mammo for RT axillary LAN and recommended bx. 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 Bx orders signed. Will f/u with results.

## 2017-12-07 ENCOUNTER — Telehealth: Payer: Self-pay

## 2017-12-07 ENCOUNTER — Ambulatory Visit
Admission: RE | Admit: 2017-12-07 | Discharge: 2017-12-07 | Disposition: A | Discharge status: Home / Self Care | Payer: Commercial Managed Care - PPO | Source: Ambulatory Visit | Attending: Obstetrics and Gynecology | Admitting: Obstetrics and Gynecology

## 2017-12-07 DIAGNOSIS — R928 Other abnormal and inconclusive findings on diagnostic imaging of breast: Secondary | ICD-10-CM

## 2017-12-07 HISTORY — PX: BREAST BIOPSY: SHX20

## 2017-12-07 NOTE — Telephone Encounter (Signed)
Spoke to pt

## 2017-12-07 NOTE — Telephone Encounter (Signed)
Pt said after screening mammogram a large lymph node was found and pt had breast biopsy earlier today. Pt was advised to let PCP know what was going on and to ck if pt needs to have labs to ck WBC. Pt said she usually runs a low WBC but pt was advised this type thing can change the WBC. Pt request cb. Pamala Hurry NP out of office until 12/11/17. Will send note to Dr Alphonsus Sias who is in office and Pamala Hurry NP as Lorain Childes. (last CBC done 12/12/16)

## 2017-12-07 NOTE — Telephone Encounter (Signed)
Please let her know that blood work is not needed. Based on the study, a biopsy of the lymph node is recommended (but the blood count will not affect that decision)

## 2017-12-10 ENCOUNTER — Encounter: Payer: Self-pay | Admitting: Internal Medicine

## 2017-12-10 LAB — SURGICAL PATHOLOGY

## 2017-12-14 ENCOUNTER — Encounter: Payer: Self-pay | Admitting: Internal Medicine

## 2017-12-14 ENCOUNTER — Ambulatory Visit (INDEPENDENT_AMBULATORY_CARE_PROVIDER_SITE_OTHER): Payer: Commercial Managed Care - PPO | Admitting: Internal Medicine

## 2017-12-14 VITALS — BP 114/74 | HR 84 | Temp 98.4°F | Ht 63.5 in | Wt 164.0 lb

## 2017-12-14 DIAGNOSIS — D709 Neutropenia, unspecified: Secondary | ICD-10-CM | POA: Diagnosis not present

## 2017-12-14 DIAGNOSIS — Z0000 Encounter for general adult medical examination without abnormal findings: Secondary | ICD-10-CM | POA: Diagnosis not present

## 2017-12-14 DIAGNOSIS — M2550 Pain in unspecified joint: Secondary | ICD-10-CM

## 2017-12-14 MED ORDER — MELOXICAM 15 MG PO TABS: 15.0000 mg | ORAL_TABLET | Freq: Every day | ORAL | 0 refills | Status: DC

## 2017-12-14 NOTE — Patient Instructions (Signed)

## 2017-12-14 NOTE — Progress Notes (Signed)
Subjective:    Patient ID: Jenny Castillo, female    DOB: 1975-10-21, 41 y.o.   MRN: [id number]  HPI  Pt presents to the clinic today for her annual exam.  Polyarthralgia's: Negative rheumatology workup by Dr. Renard Matter. Referred to [facility] rheumatology but they declined to see her as a patient. Persistent pain, no swelling. Working out now 5 days a week, no improvement. She saw podiatrist for pain in her feet, he felt like she needed referral to rheumatology for further evaluation. She is taking anti inflammatories as needed with minimal relief.   Immune Neutropenia: Not currently following with hematology. Flu: 10/2017 Tetanus: > 10 years Pap Smear: 11/2015, [facility] Mammogram: 11/2017 Vision Screening: annually Dentist: biannually  Diet: She does eat meat. She consumes fruits and veggies daily. She does eat fried foods occasionally. She drinks mostly coffee, water. Exercise: 5 days week, [employer]   Review of Systems      Past Medical History:  Diagnosis Date  . Abnormal white blood cell (WBC)    low; white blood cell disorder  . Chronic fatigue   . Galactorrhea   . Immune neutropenia (HCC)   . Thyroid nodule     Current Outpatient Medications  Medication Sig Dispense Refill  . ibuprofen (ADVIL,MOTRIN) 200 MG tablet Take 600 mg by mouth every morning.    [name] levonorgestrel (MIRENA) 20 MCG/24HR IUD 1 each by Intrauterine route once. Inserted 01/2016     No current facility-administered medications for this visit.     Allergies  Allergen Reactions  . Bactrim [Sulfamethoxazole-Trimethoprim] Other (See Comments)    Has a low blood count has to stay away from certain medications   . Ceftin [Cefuroxime Axetil] Other (See Comments)  . Reglan [Metoclopramide] Other (See Comments)    Heavy pressure in chest , can't breathe  . Sulfa Antibiotics Other (See Comments)  . Rocephin [Ceftriaxone Sodium In Dextrose] Rash    Swelling     Family History  Problem Relation Age of Onset    . Cancer Mother 56       kidney-renal cell  . Stroke Father   . Hypertension Father   . Asthma Maternal Grandmother   . Hypertension Maternal Grandmother   . Stroke Paternal Grandmother   . Breast cancer Neg Hx     Social History   Socioeconomic History  . Marital status: Married    Spouse name: Not on file  . Number of children: 3  . Years of education: 43  . Highest education level: Not on file  Occupational History  . Occupation: Sales executive  . Financial resource strain: Not on file  . Food insecurity:    Worry: Not on file    Inability: Not on file  . Transportation needs:    Medical: Not on file    Non-medical: Not on file  Tobacco Use  . Smoking status: Former Games developer  . Smokeless tobacco: Never Used  Substance and Sexual Activity  . Alcohol use: Yes    Alcohol/week: 0.0 standard drinks  . Drug use: No  . Sexual activity: Yes  Lifestyle  . Physical activity:    Days per week: 2 days    Minutes per session: 30 min  . Stress: Not at all  Relationships  . Social connections:    Talks on phone: More than three times a week    Gets together: Twice a week    Attends religious service: More than 4 times per year  Active member of club or organization: Yes    Attends meetings of clubs or organizations: 1 to 4 times per year    Relationship status: Married  . Intimate partner violence:    Fear of current or ex partner: No    Emotionally abused: No    Physically abused: No    Forced sexual activity: No  Other Topics Concern  . Not on file  Social History Narrative  . Not on file     Constitutional: Denies fever, malaise, fatigue, headache or abrupt weight changes.  HEENT: Denies eye pain, eye redness, ear pain, ringing in the ears, wax buildup, runny nose, nasal congestion, bloody nose, or sore throat. Respiratory: Denies difficulty breathing, shortness of breath, cough or sputum production.   Cardiovascular: Denies chest pain, chest  tightness, palpitations or swelling in the hands or feet.  Gastrointestinal: Pt reports mass of belly button. Denies abdominal pain, bloating, constipation, diarrhea or blood in the stool.  GU: Denies urgency, frequency, pain with urination, burning sensation, blood in urine, odor or discharge. Musculoskeletal: Pt reports multiple joint pains. Denies decrease in range of motion, difficulty with gait, muscle pain or joint swelling.  Skin: Denies redness, rashes, lesions or ulcercations.  Neurological: Denies dizziness, difficulty with memory, difficulty with speech or problems with balance and coordination.  Psych: Denies anxiety, depression, SI/HI.  No other specific complaints in a complete review of systems (except as listed in HPI above).  Objective:   Physical Exam  BP 114/74   Pulse 84   Temp 98.4 F (36.9 C) (Oral)   Ht 5' 3.5" (1.613 m)   Wt 164 lb (74.4 kg)   SpO2 98%   BMI 28.60 kg/m   Wt Readings from Last 3 Encounters:  09/17/17 163 lb (73.9 kg)  12/20/16 153 lb (69.4 kg)  12/12/16 152 lb (68.9 kg)    General: Appears her stated age, well developed, well nourished in NAD. Skin: Warm, dry and intact.  HEENT: Head: normal shape and size; Eyes: sclera white, no icterus, conjunctiva pink, PERRLA and EOMs intact; Ears: Tm's gray and intact, normal light reflex; Throat/Mouth: Teeth present, mucosa pink and moist, no exudate, lesions or ulcerations noted.  Neck:  Neck supple, trachea midline. No masses, lumps or thyromegaly present.  Cardiovascular: Normal rate and rhythm. S1,S2 noted.  No murmur, rubs or gallops noted. No JVD or BLE edema.  Pulmonary/Chest: Normal effort and positive vesicular breath sounds. No respiratory distress. No wheezes, rales or ronchi noted.  Abdomen: Soft and nontender. Normal bowel sounds. Umbilical hernia noted. Liver, spleen and kidneys non palpable. Musculoskeletal: Strength 5/5 BUE/BLE. No difficulty with gait.  Neurological: Alert and  oriented. Cranial nerves II-XII grossly intact. Coordination normal.  Psychiatric: Mood and affect normal. Behavior is normal. Judgment and thought content normal.     BMET    Component Value Date/Time   NA 139 12/12/2016 1004   K 4.3 12/20/2016 1733   CL 103 12/12/2016 1004   CO2 29 12/12/2016 1004   GLUCOSE 90 12/12/2016 1004   BUN 8 12/12/2016 1004   CREATININE 0.74 12/12/2016 1004   CALCIUM 9.6 12/12/2016 1004    Lipid Panel     Component Value Date/Time   CHOL 131 12/12/2016 1004   TRIG 72.0 12/12/2016 1004   HDL 43.90 12/12/2016 1004   CHOLHDL 3 12/12/2016 1004   VLDL 14.4 12/12/2016 1004   LDLCALC 73 12/12/2016 1004    CBC    Component Value Date/Time   WBC 4.5  12/12/2016 1004   RBC 4.32 12/12/2016 1004   HGB 13.5 12/12/2016 1004   HCT 40.8 12/12/2016 1004   PLT 188.0 12/12/2016 1004   MCV 94.5 12/12/2016 1004   MCH 31.3 11/16/2015 1505   MCHC 33.0 12/12/2016 1004   RDW 12.2 12/12/2016 1004   LYMPHSABS 1.0 12/12/2016 1004   MONOABS 0.8 12/12/2016 1004   EOSABS 0.1 12/12/2016 1004   BASOSABS 0.0 12/12/2016 1004    Hgb A1C Lab Results  Component Value Date   HGBA1C 5.7 12/20/2016           Assessment & Plan:   Preventative Health Maintenance:  Flu shot UTD She declines tetanus booster today Pap smear and mammogram UTD Encouraged her to consume a balanced diet and exercise regimen Advised her to see an eye doctor and dentist annually Will check CBC, CMET, Lipid, TSH and Vit D today  RTC in 1 year, sooner if needed Nicki Reaper, NP

## 2017-12-15 ENCOUNTER — Other Ambulatory Visit: Payer: Self-pay | Admitting: Internal Medicine

## 2017-12-15 DIAGNOSIS — R7989 Other specified abnormal findings of blood chemistry: Secondary | ICD-10-CM

## 2017-12-15 LAB — COMPREHENSIVE METABOLIC PANEL
AG Ratio: 1.6 (calc) (ref 1.0–2.5)
ALT: 13 U/L (ref 6–29)
AST: 25 U/L (ref 10–30)
Albumin: 4.6 g/dL (ref 3.6–5.1)
Alkaline phosphatase (APISO): 48 U/L (ref 33–115)
BUN: 14 mg/dL (ref 7–25)
CO2: 21 mmol/L (ref 20–32)
Calcium: 9.6 mg/dL (ref 8.6–10.2)
Chloride: 103 mmol/L (ref 98–110)
Creat: 0.87 mg/dL (ref 0.50–1.10)
Globulin: 2.8 g/dL (calc) (ref 1.9–3.7)
Glucose, Bld: 94 mg/dL (ref 65–99)
Potassium: 4.1 mmol/L (ref 3.5–5.3)
Sodium: 137 mmol/L (ref 135–146)
Total Bilirubin: 0.3 mg/dL (ref 0.2–1.2)
Total Protein: 7.4 g/dL (ref 6.1–8.1)

## 2017-12-15 LAB — CBC WITH DIFFERENTIAL/PLATELET
Basophils Absolute: 30 cells/uL (ref 0–200)
Basophils Relative: 0.8 %
Eosinophils Absolute: 70 cells/uL (ref 15–500)
Eosinophils Relative: 1.9 %
HCT: 39.6 % (ref 35.0–45.0)
Hemoglobin: 13.3 g/dL (ref 11.7–15.5)
Lymphs Abs: 1380 cells/uL (ref 850–3900)
MCH: 31.1 pg (ref 27.0–33.0)
MCHC: 33.6 g/dL (ref 32.0–36.0)
MCV: 92.5 fL (ref 80.0–100.0)
MPV: 11.5 fL (ref 7.5–12.5)
Monocytes Relative: 15.7 %
Neutro Abs: 1639 cells/uL (ref 1500–7800)
Neutrophils Relative %: 44.3 %
Platelets: 196 10*3/uL (ref 140–400)
RBC: 4.28 10*6/uL (ref 3.80–5.10)
RDW: 11.8 % (ref 11.0–15.0)
Total Lymphocyte: 37.3 %
WBC mixed population: 581 cells/uL (ref 200–950)
WBC: 3.7 10*3/uL — ABNORMAL LOW (ref 3.8–10.8)

## 2017-12-15 LAB — LIPID PANEL
Cholesterol: 158 mg/dL (ref <200)
HDL: 43 mg/dL — ABNORMAL LOW (ref >50)
LDL Cholesterol (Calc): 92 mg/dL (calc)
Non-HDL Cholesterol (Calc): 115 mg/dL (calc) (ref <130)
Total CHOL/HDL Ratio: 3.7 (calc) (ref <5.0)
Triglycerides: 124 mg/dL (ref <150)

## 2017-12-15 LAB — VITAMIN D 25 HYDROXY (VIT D DEFICIENCY, FRACTURES): Vit D, 25-Hydroxy: 34 ng/mL (ref 30–100)

## 2017-12-15 LAB — TSH: TSH: 6.19 mIU/L — ABNORMAL HIGH

## 2017-12-15 NOTE — Assessment & Plan Note (Signed)
CBC today.  

## 2017-12-15 NOTE — Assessment & Plan Note (Signed)
Will refer to rheumatology at [facility] Meloxicam 15 mg daily

## 2018-01-03 ENCOUNTER — Other Ambulatory Visit (INDEPENDENT_AMBULATORY_CARE_PROVIDER_SITE_OTHER): Payer: Commercial Managed Care - PPO

## 2018-01-03 DIAGNOSIS — R7989 Other specified abnormal findings of blood chemistry: Secondary | ICD-10-CM

## 2018-01-03 LAB — TSH: TSH: 5.15 u[IU]/mL — ABNORMAL HIGH (ref 0.35–4.50)

## 2018-01-03 LAB — T4, FREE: Free T4: 0.6 ng/dL (ref 0.60–1.60)

## 2018-01-04 ENCOUNTER — Encounter: Payer: Self-pay | Admitting: Internal Medicine

## 2018-01-04 ENCOUNTER — Other Ambulatory Visit: Payer: Commercial Managed Care - PPO

## 2018-01-04 DIAGNOSIS — E039 Hypothyroidism, unspecified: Secondary | ICD-10-CM | POA: Insufficient documentation

## 2018-01-04 DIAGNOSIS — R7989 Other specified abnormal findings of blood chemistry: Secondary | ICD-10-CM

## 2018-01-04 MED ORDER — LEVOTHYROXINE SODIUM 25 MCG PO TABS: 25.0000 ug | ORAL_TABLET | Freq: Every day | ORAL | 0 refills | Status: DC

## 2018-01-08 ENCOUNTER — Other Ambulatory Visit: Payer: Self-pay | Admitting: Internal Medicine

## 2018-01-31 ENCOUNTER — Other Ambulatory Visit (INDEPENDENT_AMBULATORY_CARE_PROVIDER_SITE_OTHER): Payer: Commercial Managed Care - PPO

## 2018-01-31 ENCOUNTER — Other Ambulatory Visit: Payer: Self-pay | Admitting: Internal Medicine

## 2018-01-31 DIAGNOSIS — R7989 Other specified abnormal findings of blood chemistry: Secondary | ICD-10-CM | POA: Diagnosis not present

## 2018-01-31 LAB — TSH: TSH: 2.97 u[IU]/mL (ref 0.35–4.50)

## 2018-02-02 ENCOUNTER — Other Ambulatory Visit: Payer: Self-pay | Admitting: Internal Medicine

## 2018-02-05 ENCOUNTER — Encounter: Payer: Self-pay | Admitting: Internal Medicine

## 2018-02-05 MED ORDER — LEVOTHYROXINE SODIUM 25 MCG PO TABS: 25.0000 ug | ORAL_TABLET | Freq: Every day | ORAL | 2 refills | Status: DC

## 2018-02-18 ENCOUNTER — Other Ambulatory Visit: Payer: Self-pay | Admitting: Internal Medicine

## 2018-02-21 DIAGNOSIS — H9201 Otalgia, right ear: Secondary | ICD-10-CM | POA: Diagnosis not present

## 2018-02-21 DIAGNOSIS — E039 Hypothyroidism, unspecified: Secondary | ICD-10-CM | POA: Diagnosis not present

## 2018-04-15 ENCOUNTER — Other Ambulatory Visit: Payer: Self-pay | Admitting: Internal Medicine

## 2018-08-04 ENCOUNTER — Encounter: Payer: Self-pay | Admitting: Internal Medicine

## 2018-08-04 DIAGNOSIS — D709 Neutropenia, unspecified: Secondary | ICD-10-CM

## 2018-08-04 DIAGNOSIS — E039 Hypothyroidism, unspecified: Secondary | ICD-10-CM

## 2018-08-05 NOTE — Telephone Encounter (Signed)
Does patient need an appointment to discuss first?

## 2018-08-09 ENCOUNTER — Other Ambulatory Visit (INDEPENDENT_AMBULATORY_CARE_PROVIDER_SITE_OTHER): Payer: Commercial Managed Care - PPO

## 2018-08-09 DIAGNOSIS — D709 Neutropenia, unspecified: Secondary | ICD-10-CM | POA: Diagnosis not present

## 2018-08-09 DIAGNOSIS — E039 Hypothyroidism, unspecified: Secondary | ICD-10-CM

## 2018-08-09 LAB — CBC WITH DIFFERENTIAL/PLATELET
Basophils Absolute: 0 10*3/uL (ref 0.0–0.1)
Basophils Relative: 1.2 % (ref 0.0–3.0)
Eosinophils Absolute: 0.1 10*3/uL (ref 0.0–0.7)
Eosinophils Relative: 2.4 % (ref 0.0–5.0)
HCT: 40 % (ref 36.0–46.0)
Hemoglobin: 13.6 g/dL (ref 12.0–15.0)
Lymphocytes Relative: 41.4 % (ref 12.0–46.0)
Lymphs Abs: 1.2 10*3/uL (ref 0.7–4.0)
MCHC: 34 g/dL (ref 30.0–36.0)
MCV: 93.4 fl (ref 78.0–100.0)
Monocytes Absolute: 0.4 10*3/uL (ref 0.1–1.0)
Monocytes Relative: 14.2 % — ABNORMAL HIGH (ref 3.0–12.0)
Neutro Abs: 1.2 10*3/uL — ABNORMAL LOW (ref 1.4–7.7)
Neutrophils Relative %: 40.8 % — ABNORMAL LOW (ref 43.0–77.0)
Platelets: 197 10*3/uL (ref 150.0–400.0)
RBC: 4.28 Mil/uL (ref 3.87–5.11)
RDW: 12.6 % (ref 11.5–15.5)
WBC: 3 10*3/uL — ABNORMAL LOW (ref 4.0–10.5)

## 2018-08-09 LAB — T4, FREE: Free T4: 0.78 ng/dL (ref 0.60–1.60)

## 2018-08-09 LAB — TSH: TSH: 3.09 u[IU]/mL (ref 0.35–4.50)

## 2018-08-09 NOTE — Addendum Note (Signed)
Addended by: Ellamae Sia on: 08/09/2018 02:24 PM   Modules accepted: Orders

## 2018-09-24 ENCOUNTER — Other Ambulatory Visit: Payer: Self-pay

## 2018-09-24 ENCOUNTER — Ambulatory Visit (INDEPENDENT_AMBULATORY_CARE_PROVIDER_SITE_OTHER)
Admission: RE | Admit: 2018-09-24 | Discharge: 2018-09-24 | Disposition: A | Discharge status: Home / Self Care | Payer: Commercial Managed Care - PPO | Source: Ambulatory Visit | Attending: Internal Medicine | Admitting: Internal Medicine

## 2018-09-24 ENCOUNTER — Ambulatory Visit: Payer: Commercial Managed Care - PPO | Admitting: Internal Medicine

## 2018-09-24 ENCOUNTER — Encounter: Payer: Self-pay | Admitting: Internal Medicine

## 2018-09-24 VITALS — BP 112/74 | HR 79 | Temp 98.4°F | Wt 167.0 lb

## 2018-09-24 DIAGNOSIS — M25521 Pain in right elbow: Secondary | ICD-10-CM | POA: Diagnosis not present

## 2018-09-24 MED ORDER — PREDNISONE 10 MG PO TABS: ORAL_TABLET | ORAL | 0 refills | Status: DC

## 2018-09-24 NOTE — Progress Notes (Signed)
Subjective:    Patient ID: Jenny Castillo, female    DOB: Aug 13, 1975, 43 y.o.   MRN: [id number]  HPI  Pt presents to the clinic today with c/o elbow pain. This started 4 days ago. She describes the pain as achy, tight, burning pressure. The pain is worse with movement. She reports the elbow is tender to touch. She has not noticed any swelling or redness. She denies any injury to the area. She has taken Ibuprofen with minimal relief.  Review of Systems      Past Medical History:  Diagnosis Date  . Abnormal white blood cell (WBC)    low; white blood cell disorder  . Chronic fatigue   . Galactorrhea   . Immune neutropenia (HCC)   . Thyroid nodule     Current Outpatient Medications  Medication Sig Dispense Refill  . ibuprofen (ADVIL,MOTRIN) 200 MG tablet Take 600 mg by mouth every morning.    [name] levonorgestrel (MIRENA) 20 MCG/24HR IUD 1 each by Intrauterine route once. Inserted 01/2016    . levothyroxine (SYNTHROID, LEVOTHROID) 25 MCG tablet TAKE 1 TABLET (25 MCG TOTAL) BY MOUTH DAILY BEFORE BREAKFAST. 90 tablet 1  . meloxicam (MOBIC) 15 MG tablet TAKE 1 TABLET BY MOUTH EVERY DAY 30 tablet 2   No current facility-administered medications for this visit.     Allergies  Allergen Reactions  . Bactrim [Sulfamethoxazole-Trimethoprim] Other (See Comments)    Has a low blood count has to stay away from certain medications   . Ceftin [Cefuroxime Axetil] Other (See Comments)  . Reglan [Metoclopramide] Other (See Comments)    Heavy pressure in chest , can't breathe  . Sulfa Antibiotics Other (See Comments)  . Rocephin [Ceftriaxone Sodium In Dextrose] Rash    Swelling     Family History  Problem Relation Age of Onset  . Cancer Mother 9       kidney-renal cell  . Stroke Father   . Hypertension Father   . Asthma Maternal Grandmother   . Hypertension Maternal Grandmother   . Stroke Paternal Grandmother   . Breast cancer Neg Hx     Social History   Socioeconomic History  .  Marital status: Married    Spouse name: Not on file  . Number of children: 3  . Years of education: 40  . Highest education level: Not on file  Occupational History  . Occupation: Sales executive  . Financial resource strain: Not on file  . Food insecurity    Worry: Not on file    Inability: Not on file  . Transportation needs    Medical: Not on file    Non-medical: Not on file  Tobacco Use  . Smoking status: Former Games developer  . Smokeless tobacco: Never Used  Substance and Sexual Activity  . Alcohol use: Yes    Alcohol/week: 0.0 standard drinks  . Drug use: No  . Sexual activity: Yes  Lifestyle  . Physical activity    Days per week: 2 days    Minutes per session: 30 min  . Stress: Not at all  Relationships  . Social connections    Talks on phone: More than three times a week    Gets together: Twice a week    Attends religious service: More than 4 times per year    Active member of club or organization: Yes    Attends meetings of clubs or organizations: 1 to 4 times per year    Relationship status: Married  . Intimate  partner violence    Fear of current or ex partner: No    Emotionally abused: No    Physically abused: No    Forced sexual activity: No  Other Topics Concern  . Not on file  Social History Narrative  . Not on file     Constitutional: Denies fever, malaise, fatigue, headache or abrupt weight changes.  Respiratory: Denies difficulty breathing, shortness of breath, cough or sputum production.   Cardiovascular: Denies chest pain, chest tightness, palpitations or swelling in the hands or feet.  Musculoskeletal: Pt reports elbow pain. Denies decrease in range of motion, difficulty with gait, muscle pain or joint swelling.  Skin:  Denies redness, rashes, lesions or ulcercations.    No other specific complaints in a complete review of systems (except as listed in HPI above).  Objective:   Physical Exam  BP 112/74   Pulse 79   Temp 98.4 F (36.9  C) (Temporal)   Wt 167 lb (75.8 kg)   SpO2 98%   BMI 29.12 kg/m  Wt Readings from Last 3 Encounters:  09/24/18 167 lb (75.8 kg)  12/14/17 164 lb (74.4 kg)  09/17/17 163 lb (73.9 kg)    General: Appears her stated age, well developed, well nourished in NAD. Skin: Warm, dry and intact. No redness or warmth noted. Cardiovascular: Normal rate and rhythm. S1,S2 noted.  No murmur, rubs or gallops noted.  Pulmonary/Chest: Normal effort and positive vesicular breath sounds. No respiratory distress. No wheezes, rales or ronchi noted.  Musculoskeletal: Pain with flexion and extension of the right elbow. Pain with palpation over the lateral epicondyle. No joint swelling noted. Hand grips equal.   Neurological: Alert and oriented. Coordination normal.    BMET    Component Value Date/Time   NA 137 12/14/2017 1502   K 4.1 12/14/2017 1502   CL 103 12/14/2017 1502   CO2 21 12/14/2017 1502   GLUCOSE 94 12/14/2017 1502   BUN 14 12/14/2017 1502   CREATININE 0.87 12/14/2017 1502   CALCIUM 9.6 12/14/2017 1502    Lipid Panel     Component Value Date/Time   CHOL 158 12/14/2017 1502   TRIG 124 12/14/2017 1502   HDL 43 (L) 12/14/2017 1502   CHOLHDL 3.7 12/14/2017 1502   VLDL 14.4 12/12/2016 1004   LDLCALC 92 12/14/2017 1502    CBC    Component Value Date/Time   WBC 3.0 (L) 08/09/2018 1424   RBC 4.28 08/09/2018 1424   HGB 13.6 08/09/2018 1424   HCT 40.0 08/09/2018 1424   PLT 197.0 08/09/2018 1424   MCV 93.4 08/09/2018 1424   MCH 31.1 12/14/2017 1502   MCHC 34.0 08/09/2018 1424   RDW 12.6 08/09/2018 1424   LYMPHSABS 1.2 08/09/2018 1424   MONOABS 0.4 08/09/2018 1424   EOSABS 0.1 08/09/2018 1424   BASOSABS 0.0 08/09/2018 1424    Hgb A1C Lab Results  Component Value Date   HGBA1C 5.7 12/20/2016            Assessment & Plan:   Right Elbow Pain:  C/w tennis elbow Xray right elbow today Placed in sling for comfort RX for Pred Taper x 9 days- avoid NSAID's OTC Elbow  exercises given  Will follow up after xray, return precautions discussed Webb Silversmith, NP

## 2018-09-24 NOTE — Patient Instructions (Signed)
Tennis Elbow Rehab Ask your health care provider which exercises are safe for you. Do exercises exactly as told by your health care provider and adjust them as directed. It is normal to feel mild stretching, pulling, tightness, or discomfort as you do these exercises. Stop right away if you feel sudden pain or your pain gets worse. Do not begin these exercises until told by your health care provider. Stretching and range-of-motion exercises These exercises warm up your muscles and joints and improve the movement and flexibility of your elbow. These exercises also help to relieve pain, numbness, and tingling. Wrist flexion, assisted  1. Straighten your left / right elbow in front of you with your palm facing down toward the floor. ? If told by your health care provider, bend your left / right elbow to a 90-degree angle (right angle) at your side. 2. With your other hand, gently push over the back of your left / right hand so your fingers point toward the floor (flexion). Stop when you feel a gentle stretch on the back of your forearm. 3. Hold this position for __________ seconds. Repeat __________ times. Complete this exercise __________ times a day. Wrist extension, assisted  1. Straighten your left / right elbow in front of you with your palm facing up toward the ceiling. ? If told by your health care provider, bend your left / right elbow to a 90-degree angle (right angle) at your side. 2. With your other hand, gently pull your left / right hand and fingers toward the floor (extension). Stop when you feel a gentle stretch on the palm side of your forearm. 3. Hold this position for __________ seconds. Repeat __________ times. Complete this exercise __________ times a day. Assisted forearm rotation, supination 1. Sit or stand with your left / right elbow bent to a 90-degree angle (right angle) at your side. 2. Using your uninjured hand, turn (rotate) your left / right palm up toward the ceiling  (supination) until you feel a gentle stretch along the inside of your forearm. 3. Hold this position for __________ seconds. Repeat __________ times. Complete this exercise __________ times a day. Assisted forearm rotation, pronation 1. Sit or stand with your left / right elbow bent to a 90-degree angle (right angle) at your side. 2. Using your uninjured hand, rotate your left / right palm down toward the floor (pronation) until you feel a gentle stretch along the outside of your forearm. 3. Hold this position for __________ seconds. Repeat __________ times. Complete this exercise __________ times a day. Strengthening exercises These exercises build strength and endurance in your forearm and elbow. Endurance is the ability to use your muscles for a long time, even after they get tired. Radial deviation  1. Stand with a __________ weight or a hammer in your left / right hand. Or, sit while holding a rubber exercise band or tubing, with your left / right forearm supported on a table or countertop. ? If you are standing, position your forearm so that your thumb is facing forward. If you are sitting, position your forearm so that the thumb is facing the ceiling. This is the neutral position. 2. Raise your hand upward in front of you so your thumb moves toward the ceiling (radial deviation), or pull up on the rubber tubing. Keep your forearm and elbow still while you move your wrist only. 3. Hold this position for __________ seconds. 4. Slowly return to the starting position. Repeat __________ times. Complete this exercise __________ times   a day. Wrist extension, eccentric 1. Sit with your left / right forearm palm-down and supported on a table or other surface. Let your left / right wrist extend over the edge of the surface. 2. Hold a __________ weight or a piece of exercise band or tubing in your left / right hand. ? If using a rubber exercise band or tubing, hold the other end of the tubing with  your other hand. 3. Use your uninjured hand to move your left / right hand up toward the ceiling. 4. Take your uninjured hand away and slowly return to the starting position using only your left / right hand. Lowering your arm under tension is called eccentric extension. Repeat __________ times. Complete this exercise __________ times a day. Wrist extension Do not do this exercise if it causes pain at the outside of your elbow. Only do this exercise once instructed by your health care provider. 1. Sit with your left / right forearm supported on a table or other surface and your palm turned down toward the floor. Let your left / right wrist extend over the edge of the surface. 2. Hold a __________ weight or a piece of rubber exercise band or tubing. ? If you are using a rubber exercise band or tubing, hold the band or tubing in place with your other hand to provide resistance. 3. Slowly bend your wrist so your hand moves up toward the ceiling (extension). Move only your wrist, keeping your forearm and elbow still. 4. Hold this position for __________ seconds. 5. Slowly return to the starting position. Repeat __________ times. Complete this exercise __________ times a day. Forearm rotation, supination To do this exercise, you will need a lightweight hammer or rubber mallet. 1. Sit with your left / right forearm supported on a table or other surface. Bend your elbow to a 90-degree angle (right angle). Position your forearm so that your palm is facing down toward the floor, with your hand resting over the edge of the table. 2. Hold a hammer in your left / right hand. ? To make this exercise easier, hold the hammer near the head of the hammer. ? To make this exercise harder, hold the hammer near the end of the handle. 3. Without moving your wrist or elbow, slowly rotate your forearm so your palm faces up toward the ceiling (supination). 4. Hold this position for __________ seconds. 5. Slowly return  to the starting position. Repeat __________ times. Complete this exercise __________ times a day. Shoulder blade squeeze 1. Sit in a stable chair or stand with good posture. If you are sitting down, do not let your back touch the back of the chair. 2. Your arms should be at your sides with your elbows bent to a 90-degree angle (right angle). Position your forearms so that your thumbs are facing the ceiling (neutral position). 3. Without lifting your shoulders up, squeeze your shoulder blades tightly together. 4. Hold this position for __________ seconds. 5. Slowly release and return to the starting position. Repeat __________ times. Complete this exercise __________ times a day. This information is not intended to replace advice given to you by your health care provider. Make sure you discuss any questions you have with your health care provider. Document Released: 01/09/2005 Document Revised: 05/02/2018 Document Reviewed: 03/05/2018 [employer] Patient Education  2020 [employer]  

## 2018-10-27 ENCOUNTER — Other Ambulatory Visit: Payer: Self-pay | Admitting: Internal Medicine

## 2018-10-29 ENCOUNTER — Other Ambulatory Visit: Payer: Self-pay

## 2018-10-29 ENCOUNTER — Encounter: Payer: Self-pay | Admitting: Internal Medicine

## 2018-10-29 ENCOUNTER — Ambulatory Visit: Payer: Commercial Managed Care - PPO | Admitting: Internal Medicine

## 2018-10-29 VITALS — BP 114/76 | HR 76 | Temp 98.3°F | Wt 167.0 lb

## 2018-10-29 DIAGNOSIS — N951 Menopausal and female climacteric states: Secondary | ICD-10-CM

## 2018-10-29 DIAGNOSIS — L659 Nonscarring hair loss, unspecified: Secondary | ICD-10-CM

## 2018-10-29 DIAGNOSIS — E039 Hypothyroidism, unspecified: Secondary | ICD-10-CM

## 2018-10-29 NOTE — Progress Notes (Signed)
Subjective:    Patient ID: Jenny Castillo, female    DOB: 1975/06/04, 43 y.o.   MRN: [id number]  HPI  Pt presents to the clinic today with c/o "clumps of hair falling out". She noticed this about 3 months ago but reports intermittently she is losing big clumps of hair in the shower. She has not noticed bald spots. TSH was normal 07/2018. She only uses shampoo and conditioner. She does not use hair products, hair dryer, straightener, etc. She has tried using a wide tooth comb instead of a brush. She denis brittle or breaking nails. She has not tried any Biotin OTC. She is not overly stressed.  Review of Systems  Past Medical History:  Diagnosis Date  . Abnormal white blood cell (WBC)    low; white blood cell disorder  . Chronic fatigue   . Galactorrhea   . 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 Thyroid nodule     Current Outpatient Medications  Medication Sig Dispense Refill  . ibuprofen (ADVIL,MOTRIN) 200 MG tablet Take 600 mg by mouth every morning.    [name] levonorgestrel (MIRENA) 20 MCG/24HR IUD 1 each by Intrauterine route once. Inserted 01/2016    . levothyroxine (SYNTHROID) 25 MCG tablet TAKE 1 TABLET (25 MCG TOTAL) BY MOUTH DAILY BEFORE BREAKFAST. 90 tablet 0  . meloxicam (MOBIC) 15 MG tablet TAKE 1 TABLET BY MOUTH EVERY DAY (Patient not taking: Reported on 09/24/2018) 30 tablet 2  . predniSONE (DELTASONE) 10 MG tablet Take 3 tabs on days 1-3, take 2 tabs on days 4-6, take 1 tab on days 7-9 18 tablet 0   No current facility-administered medications for this visit.     Allergies  Allergen Reactions  . Bactrim [Sulfamethoxazole-Trimethoprim] Other (See Comments)    Has a low blood count has to stay away from certain medications   . Ceftin [Cefuroxime Axetil] Other (See Comments)  . Reglan [Metoclopramide] Other (See Comments)    Heavy pressure in chest , can't breathe  . Sulfa Antibiotics Other (See Comments)  . Rocephin [Ceftriaxone Sodium In Dextrose] Rash    Swelling     Family History   Problem Relation Age of Onset  . Cancer Mother 54       kidney-renal cell  . Stroke Father   . Hypertension Father   . Asthma Maternal Grandmother   . Hypertension Maternal Grandmother   . Stroke Paternal Grandmother   . Breast cancer Neg Hx     Social History   Socioeconomic History  . Marital status: Married    Spouse name: Not on file  . Number of children: 3  . Years of education: 34  . Highest education level: Not on file  Occupational History  . Occupation: Science writer  . Financial resource strain: Not on file  . Food insecurity    Worry: Not on file    Inability: Not on file  . Transportation needs    Medical: Not on file    Non-medical: Not on file  Tobacco Use  . Smoking status: Former Research scientist (life sciences)  . Smokeless tobacco: Never Used  Substance and Sexual Activity  . Alcohol use: Yes    Alcohol/week: 0.0 standard drinks  . Drug use: No  . Sexual activity: Yes  Lifestyle  . Physical activity    Days per week: 2 days    Minutes per session: 30 min  . Stress: Not at all  Relationships  . Social connections    Talks on phone: More than  three times a week    Gets together: Twice a week    Attends religious service: More than 4 times per year    Active member of club or organization: Yes    Attends meetings of clubs or organizations: 1 to 4 times per year    Relationship status: Married  . Intimate partner violence    Fear of current or ex partner: No    Emotionally abused: No    Physically abused: No    Forced sexual activity: No  Other Topics Concern  . Not on file  Social History Narrative  . Not on file     Constitutional: Denies fever, malaise, fatigue, headache or abrupt weight changes.  HEENT: Pt reports hair loss. Denies eye pain, eye redness, ear pain, ringing in the ears, wax buildup, runny nose, nasal congestion, bloody nose, or sore throat. Skin: Denies redness, rashes, lesions or ulcercations.   No other specific complaints in a  complete review of systems (except as listed in HPI above).     Objective:   Physical Exam  BP 114/76   Pulse 76   Temp 98.3 F (36.8 C) (Temporal)   Wt 167 lb (75.8 kg)   SpO2 98%   BMI 29.12 kg/m  Wt Readings from Last 3 Encounters:  10/29/18 167 lb (75.8 kg)  09/24/18 167 lb (75.8 kg)  12/14/17 164 lb (74.4 kg)    General: Appears her stated age, well developed, well nourished in NAD. Neck:  Neck supple, trachea midline. No masses, lumps or thyromegaly present.  Cardiovascular: Normal rate and rhythm.  Pulmonary/Chest: Normal effort and positive vesicular breath sounds. No respiratory distress. No wheezes, rales or ronchi noted.   Neurological: Alert and oriented.   BMET    Component Value Date/Time   NA 137 12/14/2017 1502   K 4.1 12/14/2017 1502   CL 103 12/14/2017 1502   CO2 21 12/14/2017 1502   GLUCOSE 94 12/14/2017 1502   BUN 14 12/14/2017 1502   CREATININE 0.87 12/14/2017 1502   CALCIUM 9.6 12/14/2017 1502    Lipid Panel     Component Value Date/Time   CHOL 158 12/14/2017 1502   TRIG 124 12/14/2017 1502   HDL 43 (L) 12/14/2017 1502   CHOLHDL 3.7 12/14/2017 1502   VLDL 14.4 12/12/2016 1004   LDLCALC 92 12/14/2017 1502    CBC    Component Value Date/Time   WBC 3.0 (L) 08/09/2018 1424   RBC 4.28 08/09/2018 1424   HGB 13.6 08/09/2018 1424   HCT 40.0 08/09/2018 1424   PLT 197.0 08/09/2018 1424   MCV 93.4 08/09/2018 1424   MCH 31.1 12/14/2017 1502   MCHC 34.0 08/09/2018 1424   RDW 12.6 08/09/2018 1424   LYMPHSABS 1.2 08/09/2018 1424   MONOABS 0.4 08/09/2018 1424   EOSABS 0.1 08/09/2018 1424   BASOSABS 0.0 08/09/2018 1424    Hgb A1C Lab Results  Component Value Date   HGBA1C 5.7 12/20/2016            Assessment & Plan:   Hair Loss:  Will check TSH, Vit D, B12, Folate, FSH, LH  Will follow up after labs, return precautions discussed Nicki Reaper, NP

## 2018-10-29 NOTE — Patient Instructions (Signed)
Alopecia Areata, Adult  Alopecia areata is a condition that causes you to lose hair. You may lose hair on your scalp in patches. In some cases, you may lose all the hair on your scalp (alopecia totalis) or all the hair from your face and body (alopecia universalis). Alopecia areata is an autoimmune disease. This means that your body's defense system (immune system) mistakes normal parts of the body for germs or other things that can make you sick. When you have alopecia areata, the immune system attacks the hair follicles. Alopecia areata usually develops in childhood, but it can develop at any age. For some people, their hair grows back on its own and hair loss does not happen again. For others, their hair may fall out and grow back in cycles. The hair loss may last many years. Having this condition can be emotionally difficult, but it is not dangerous. What are the causes? The cause of this condition is not known. What increases the risk? This condition is more likely to develop in people who have:  A family history of alopecia.  A family history of another autoimmune disease, including type 1 diabetes and rheumatoid arthritis.  Asthma and allergies.  Down syndrome. What are the signs or symptoms? Round spots of patchy hair loss on the scalp is the main symptom of this condition. The spots may be mildly itchy. Other symptoms include:  Short dark hairs in the bald patches that are wider at the top (exclamation point hairs).  Dents, white spots, or lines in the fingernails or toenails.  Balding and body hair loss. This is rare. How is this diagnosed? This condition is diagnosed based on your symptoms and family history. Your health care provider will also check your scalp skin, teeth, and nails. Your health care provider may refer you to a specialist in hair and skin disorders (dermatologist). You may also have tests, including:  A hair pull test.  Blood tests or other screening tests  to check for autoimmune diseases, such as thyroid disease or diabetes.  Skin biopsy to confirm the diagnosis.  A procedure to examine the skin with a lighted magnifying instrument (dermoscopy). How is this treated? There is no cure for alopecia areata. Treatment is aimed at promoting the regrowth of hair and preventing the immune system from overreacting. No single treatment is right for all people with alopecia areata. It depends on the type of hair loss you have and how severe it is. Work with your health care provider to find the best treatment for you. Treatment may include:  Having regular checkups to make sure the condition is not getting worse (watchful waiting).  Steroid creams or pills for 6-8 weeks to stop the immune reaction and help hair to regrow more quickly.  Other topical medicines to alter the immune system response and support the hair growth cycle.  Steroid injections.  Therapy and counseling with a support group or therapist if you are having trouble coping with hair loss. Follow these instructions at home:  Learn as much as you can about your condition.  Apply topical creams only as told by your health care provider.  Take over-the-counter and prescription medicines only as told by your health care provider.  Consider getting a wig or products to make hair look fuller or to cover bald spots, if you feel uncomfortable with your appearance.  Get therapy or counseling if you are having a hard time coping with hair loss. Ask your health care provider to recommend   a counselor or support group.  Keep all follow-up visits as told by your health care provider. This is important. Contact a health care provider if:  Your hair loss gets worse, even with treatment.  You have new symptoms.  You are struggling emotionally. Summary  Alopecia areata is an autoimmune condition that makes your body's defense system (immune system) attack the hair follicles. This causes you  to lose hair.  Treatments may include regular checkups to make sure that the condition is not getting worse (watchful waiting), medicines, and steroid injections. This information is not intended to replace advice given to you by your health care provider. Make sure you discuss any questions you have with your health care provider. Document Released: 08/14/2003 Document Revised: 12/22/2016 Document Reviewed: 01/28/2016 Elsevier Patient Education  2020 Elsevier Inc.  

## 2018-10-30 ENCOUNTER — Encounter: Payer: Self-pay | Admitting: Internal Medicine

## 2018-10-30 LAB — FOLLICLE STIMULATING HORMONE: FSH: 8.2 m[IU]/mL

## 2018-10-30 LAB — FOLATE: Folate: 20.8 ng/mL (ref >5.9)

## 2018-10-30 LAB — VITAMIN B12: Vitamin B-12: 455 pg/mL (ref 211–911)

## 2018-10-30 LAB — VITAMIN D 25 HYDROXY (VIT D DEFICIENCY, FRACTURES): VITD: 32.59 ng/mL (ref 30.00–100.00)

## 2018-10-30 LAB — LUTEINIZING HORMONE: LH: 5.19 m[IU]/mL

## 2018-10-30 LAB — TSH: TSH: 2.22 u[IU]/mL (ref 0.35–4.50)

## 2018-11-27 ENCOUNTER — Other Ambulatory Visit: Payer: Self-pay

## 2018-11-27 DIAGNOSIS — Z20822 Contact with and (suspected) exposure to covid-19: Secondary | ICD-10-CM

## 2018-11-28 LAB — NOVEL CORONAVIRUS, NAA: SARS-CoV-2, NAA: NOT DETECTED

## 2018-12-06 ENCOUNTER — Telehealth: Payer: Self-pay | Admitting: Internal Medicine

## 2018-12-06 NOTE — Telephone Encounter (Signed)
E-mail sent to billing and coding requesting review of account.

## 2018-12-06 NOTE — Telephone Encounter (Signed)
Pt has questions about her 10/29/2018 appointment.  She stated her hair was falling out.  She stated she has had this before and her thyroid was the problem.  She wanted to know if there was anything we could do she received a bill for $500.

## 2018-12-10 NOTE — Telephone Encounter (Signed)
Patient returned ShaVaughn's call.  Patient notified.

## 2018-12-10 NOTE — Telephone Encounter (Signed)
Left message for patient to call the office. Need to inform patient that there is no documentation in her chart that would support changing the diagnosis. Since her insurance has already processed the claim we can not go back and document anything new.

## 2018-12-12 ENCOUNTER — Encounter: Payer: Self-pay | Admitting: Internal Medicine

## 2018-12-12 DIAGNOSIS — E039 Hypothyroidism, unspecified: Secondary | ICD-10-CM

## 2018-12-13 ENCOUNTER — Encounter: Payer: Self-pay | Admitting: Internal Medicine

## 2019-01-27 ENCOUNTER — Other Ambulatory Visit: Payer: Self-pay | Admitting: Internal Medicine

## 2019-02-12 ENCOUNTER — Telehealth: Payer: Self-pay | Admitting: *Deleted

## 2019-02-12 NOTE — Telephone Encounter (Signed)
In office?

## 2019-02-12 NOTE — Telephone Encounter (Signed)
Patient scheduled for in office appointment tomorrow 02/13/19 at 10:00 am with Jenny Reaper NP.[name] Patient stated that she feels fine and can wait until tomorrow. ER precautions were given to patient and she verbalized understanding.

## 2019-02-12 NOTE — Telephone Encounter (Signed)
Patient called for an appointment and was transferred to triage because of her symptoms. Patient complained of abdominal pain. Patient stated that she started with abdominal pain especially during the night about 5 days ago. Patient stated that the pain goes completely across her abdomin area below her ribcage. Patient stated that she is burping a lot. Patient stated when the sharp pain hits it is at a pain level of 7. Patient stated that last week she had a cough from sinus drainage and went to [facility]  Tuesday or Wednesday. Patient stated that she had a rapid covid test done that was negative. Patient stated that she was told that she had a sinus infection, but was not given an antibiotic. Patient stated that she does not have an appetite, but feels hungry. Patient stated that when she eats a small amount of food she feels full. Patient stated that the fullness makes her feel sick to her stomach. Patient denies a fever or diarrhea. Patient stated that she does have a slight cough left over from the sinus issues she had last week. Patient stated that she is at work. Patient was given ER precautions. Please advise if patient should be scheduled in the office or a virtual visit?

## 2019-02-13 ENCOUNTER — Ambulatory Visit: Payer: Commercial Managed Care - PPO | Admitting: Internal Medicine

## 2019-02-13 ENCOUNTER — Encounter: Payer: Self-pay | Admitting: Internal Medicine

## 2019-02-13 ENCOUNTER — Other Ambulatory Visit: Payer: Self-pay

## 2019-02-13 VITALS — BP 118/78 | HR 82 | Temp 97.6°F | Wt 168.0 lb

## 2019-02-13 DIAGNOSIS — R1013 Epigastric pain: Secondary | ICD-10-CM

## 2019-02-13 DIAGNOSIS — R140 Abdominal distension (gaseous): Secondary | ICD-10-CM

## 2019-02-13 DIAGNOSIS — R110 Nausea: Secondary | ICD-10-CM

## 2019-02-13 LAB — COMPREHENSIVE METABOLIC PANEL
ALT: 15 U/L (ref 0–35)
AST: 18 U/L (ref 0–37)
Albumin: 4.7 g/dL (ref 3.5–5.2)
Alkaline Phosphatase: 52 U/L (ref 39–117)
BUN: 11 mg/dL (ref 6–23)
CO2: 29 mEq/L (ref 19–32)
Calcium: 9.5 mg/dL (ref 8.4–10.5)
Chloride: 102 mEq/L (ref 96–112)
Creatinine, Ser: 0.84 mg/dL (ref 0.40–1.20)
GFR: 73.25 mL/min (ref >60.00)
Glucose, Bld: 93 mg/dL (ref 70–99)
Potassium: 4 mEq/L (ref 3.5–5.1)
Sodium: 138 mEq/L (ref 135–145)
Total Bilirubin: 0.3 mg/dL (ref 0.2–1.2)
Total Protein: 8 g/dL (ref 6.0–8.3)

## 2019-02-13 LAB — CBC
HCT: 42.2 % (ref 36.0–46.0)
Hemoglobin: 14.3 g/dL (ref 12.0–15.0)
MCHC: 33.8 g/dL (ref 30.0–36.0)
MCV: 92.6 fl (ref 78.0–100.0)
Platelets: 189 10*3/uL (ref 150.0–400.0)
RBC: 4.55 Mil/uL (ref 3.87–5.11)
RDW: 12.1 % (ref 11.5–15.5)
WBC: 3.1 10*3/uL — ABNORMAL LOW (ref 4.0–10.5)

## 2019-02-13 LAB — AMYLASE: Amylase: 45 U/L (ref 27–131)

## 2019-02-13 LAB — H. PYLORI ANTIBODY, IGG: H Pylori IgG: NEGATIVE

## 2019-02-13 LAB — LIPASE: Lipase: 29 U/L (ref 11.0–59.0)

## 2019-02-13 MED ORDER — OMEPRAZOLE 40 MG PO CPDR: 40.0000 mg | DELAYED_RELEASE_CAPSULE | Freq: Every day | ORAL | 2 refills | Status: DC

## 2019-02-13 NOTE — Progress Notes (Signed)
Subjective:    Patient ID: Jenny Castillo, female    DOB: [birth year], 44 y.o.   MRN: [id number]  HPI  Pt presents to the clinic today with c/o abdominal pain and cramping. This started 6 days ago. The pain is achy but can feel sharp and stabbing. The pain is worse at night. She reports associated burping, feeling full even after eating small amounts. She has had some nausea but denies vomiting, constipation, diarrhea or blood in her stool. She has taken Ibuprofen and Tums without any relief. She denies recent changes in medication or diet. No one in her home has had similar symptoms.  Review of Systems      Past Medical History:  Diagnosis Date  . Abnormal white blood cell (WBC)    low; white blood cell disorder  . Chronic fatigue   . Galactorrhea   . 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 Thyroid nodule     Current Outpatient Medications  Medication Sig Dispense Refill  . ibuprofen (ADVIL,MOTRIN) 200 MG tablet Take 600 mg by mouth every morning.    [name] levonorgestrel (MIRENA) 20 MCG/24HR IUD 1 each by Intrauterine route once. Inserted 01/2016    . levothyroxine (SYNTHROID) 25 MCG tablet TAKE 1 TABLET (25 MCG TOTAL) BY MOUTH DAILY BEFORE BREAKFAST. 90 tablet 0  . meloxicam (MOBIC) 15 MG tablet TAKE 1 TABLET BY MOUTH EVERY DAY 30 tablet 2   No current facility-administered medications for this visit.    Allergies  Allergen Reactions  . Bactrim [Sulfamethoxazole-Trimethoprim] Other (See Comments)    Has a low blood count has to stay away from certain medications   . Ceftin [Cefuroxime Axetil] Other (See Comments)  . Reglan [Metoclopramide] Other (See Comments)    Heavy pressure in chest , can't breathe  . Sulfa Antibiotics Other (See Comments)  . Rocephin [Ceftriaxone Sodium In Dextrose] Rash    Swelling     Family History  Problem Relation Age of Onset  . Cancer Mother 55       kidney-renal cell  . Stroke Father   . Hypertension Father   . Asthma Maternal Grandmother   .  Hypertension Maternal Grandmother   . Stroke Paternal Grandmother   . Breast cancer Neg Hx     Social History   Socioeconomic History  . Marital status: Married    Spouse name: Not on file  . Number of children: 3  . Years of education: 54  . Highest education level: Not on file  Occupational History  . Occupation: insurance  Tobacco Use  . Smoking status: Former Research scientist (life sciences)  . Smokeless tobacco: Never Used  Substance and Sexual Activity  . Alcohol use: Yes    Alcohol/week: 0.0 standard drinks  . Drug use: No  . Sexual activity: Yes  Other Topics Concern  . Not on file  Social History Narrative  . Not on file   Social Determinants of Health   Financial Resource Strain:   . Difficulty of Paying Living Expenses: Not on file  Food Insecurity:   . Worried About Charity fundraiser in the Last Year: Not on file  . Ran Out of Food in the Last Year: Not on file  Transportation Needs:   . Lack of Transportation (Medical): Not on file  . Lack of Transportation (Non-Medical): Not on file  Physical Activity:   . Days of Exercise per Week: Not on file  . Minutes of Exercise per Session: Not on file  Stress:   . Feeling  of Stress : Not on file  Social Connections:   . Frequency of Communication with Friends and Family: Not on file  . Frequency of Social Gatherings with Friends and Family: Not on file  . Attends Religious Services: Not on file  . Active Member of Clubs or Organizations: Not on file  . Attends Banker Meetings: Not on file  . Marital Status: Not on file  Intimate Partner Violence:   . Fear of Current or Ex-Partner: Not on file  . Emotionally Abused: Not on file  . Physically Abused: Not on file  . Sexually Abused: Not on file     Constitutional: Denies fever, malaise, fatigue, headache or abrupt weight changes.  Respiratory: Denies difficulty breathing, shortness of breath, cough or sputum production.   Cardiovascular: Denies chest pain, chest  tightness, palpitations or swelling in the hands or feet.  Gastrointestinal: Pt reports abdominal pain, cramping and nausea. Denies  bloating, constipation, diarrhea or blood in the stool.  GU: Denies urgency, frequency, pain with urination, burning sensation, blood in urine, odor or discharge.  No other specific complaints in a complete review of systems (except as listed in HPI above).  Objective:   Physical Exam   BP 118/78   Pulse 82   Temp 97.6 F (36.4 C) (Temporal)   Wt 168 lb (76.2 kg)   SpO2 98%   BMI 29.29 kg/m   Wt Readings from Last 3 Encounters:  10/29/18 167 lb (75.8 kg)  09/24/18 167 lb (75.8 kg)  12/14/17 164 lb (74.4 kg)    General: Appears her stated age, well developed, well nourished in NAD. Skin: Warm, dry and intact. No rashes noted. Cardiovascular: Normal rate and rhythm. S1,S2 noted.  No murmur, rubs or gallops noted.  Pulmonary/Chest: Normal effort and positive vesicular breath sounds. No respiratory distress. No wheezes, rales or ronchi noted.  Abdomen: Soft and tender in the RUQ.Positive Murphy's sign.  Normal bowel sounds. No distention or masses noted. Liver, spleen and kidneys non palpable. Neurological: Alert and oriented.    BMET    Component Value Date/Time   NA 137 12/14/2017 1502   K 4.1 12/14/2017 1502   CL 103 12/14/2017 1502   CO2 21 12/14/2017 1502   GLUCOSE 94 12/14/2017 1502   BUN 14 12/14/2017 1502   CREATININE 0.87 12/14/2017 1502   CALCIUM 9.6 12/14/2017 1502    Lipid Panel     Component Value Date/Time   CHOL 158 12/14/2017 1502   TRIG 124 12/14/2017 1502   HDL 43 (L) 12/14/2017 1502   CHOLHDL 3.7 12/14/2017 1502   VLDL 14.4 12/12/2016 1004   LDLCALC 92 12/14/2017 1502    CBC    Component Value Date/Time   WBC 3.0 (L) 08/09/2018 1424   RBC 4.28 08/09/2018 1424   HGB 13.6 08/09/2018 1424   HCT 40.0 08/09/2018 1424   PLT 197.0 08/09/2018 1424   MCV 93.4 08/09/2018 1424   MCH 31.1 12/14/2017 1502   MCHC 34.0  08/09/2018 1424   RDW 12.6 08/09/2018 1424   LYMPHSABS 1.2 08/09/2018 1424   MONOABS 0.4 08/09/2018 1424   EOSABS 0.1 08/09/2018 1424   BASOSABS 0.0 08/09/2018 1424    Hgb A1C Lab Results  Component Value Date   HGBA1C 5.7 12/20/2016           Assessment & Plan:   Abdominal Pain, Cramping, Nausea:  GERD vs H Pylori Will check CBC, CMET, Amylase, Lipase and H Pylori today RX for Omeprazole 40  mg PO daily 30 mins before breakfast  Will follow up after labs, return precautions discussed Nicki Reaper, NP This visit occurred during the SARS-CoV-2 public health emergency.  Safety protocols were in place, including screening questions prior to the visit, additional usage of staff PPE, and extensive cleaning of exam room while observing appropriate contact time as indicated for disinfecting solutions.

## 2019-02-16 ENCOUNTER — Encounter: Payer: Self-pay | Admitting: Internal Medicine

## 2019-02-16 NOTE — Patient Instructions (Signed)

## 2019-02-26 ENCOUNTER — Other Ambulatory Visit: Payer: Self-pay | Admitting: Internal Medicine

## 2019-02-26 DIAGNOSIS — Z1231 Encounter for screening mammogram for malignant neoplasm of breast: Secondary | ICD-10-CM

## 2019-02-28 ENCOUNTER — Ambulatory Visit
Admission: RE | Admit: 2019-02-28 | Discharge: 2019-02-28 | Disposition: A | Discharge status: Home / Self Care | Payer: Commercial Managed Care - PPO | Source: Ambulatory Visit | Attending: Internal Medicine | Admitting: Internal Medicine

## 2019-02-28 DIAGNOSIS — Z1231 Encounter for screening mammogram for malignant neoplasm of breast: Secondary | ICD-10-CM | POA: Insufficient documentation

## 2019-04-27 ENCOUNTER — Other Ambulatory Visit: Payer: Self-pay | Admitting: Internal Medicine

## 2019-05-11 ENCOUNTER — Other Ambulatory Visit: Payer: Self-pay | Admitting: Internal Medicine

## 2019-07-26 ENCOUNTER — Other Ambulatory Visit: Payer: Self-pay | Admitting: Internal Medicine

## 2019-08-29 ENCOUNTER — Other Ambulatory Visit: Payer: Self-pay | Admitting: Internal Medicine

## 2019-09-28 ENCOUNTER — Other Ambulatory Visit: Payer: Self-pay | Admitting: Internal Medicine

## 2019-10-26 ENCOUNTER — Other Ambulatory Visit: Payer: Self-pay | Admitting: Internal Medicine

## 2019-12-02 ENCOUNTER — Other Ambulatory Visit: Payer: Self-pay | Admitting: Internal Medicine

## 2019-12-04 ENCOUNTER — Other Ambulatory Visit: Payer: Commercial Managed Care - PPO

## 2019-12-08 ENCOUNTER — Encounter: Payer: Self-pay | Admitting: Internal Medicine

## 2019-12-08 ENCOUNTER — Other Ambulatory Visit: Payer: Self-pay

## 2019-12-08 ENCOUNTER — Ambulatory Visit (INDEPENDENT_AMBULATORY_CARE_PROVIDER_SITE_OTHER): Payer: Commercial Managed Care - PPO | Admitting: Internal Medicine

## 2019-12-08 VITALS — BP 122/70 | HR 76 | Temp 98.3°F | Ht 63.5 in | Wt 179.0 lb

## 2019-12-08 DIAGNOSIS — E039 Hypothyroidism, unspecified: Secondary | ICD-10-CM

## 2019-12-08 DIAGNOSIS — R233 Spontaneous ecchymoses: Secondary | ICD-10-CM

## 2019-12-08 DIAGNOSIS — R238 Other skin changes: Secondary | ICD-10-CM | POA: Diagnosis not present

## 2019-12-08 DIAGNOSIS — Z0001 Encounter for general adult medical examination with abnormal findings: Secondary | ICD-10-CM

## 2019-12-08 DIAGNOSIS — Z0000 Encounter for general adult medical examination without abnormal findings: Secondary | ICD-10-CM | POA: Diagnosis not present

## 2019-12-08 DIAGNOSIS — D709 Neutropenia, unspecified: Secondary | ICD-10-CM

## 2019-12-08 DIAGNOSIS — M459 Ankylosing spondylitis of unspecified sites in spine: Secondary | ICD-10-CM | POA: Diagnosis not present

## 2019-12-08 NOTE — Assessment & Plan Note (Signed)
CBC today.  

## 2019-12-08 NOTE — Patient Instructions (Signed)
Health Maintenance, Female Adopting a healthy lifestyle and getting preventive care are important in promoting health and wellness. Ask your health care provider about:  The right schedule for you to have regular tests and exams.  Things you can do on your own to prevent diseases and keep yourself healthy. What should I know about diet, weight, and exercise? Eat a healthy diet   Eat a diet that includes plenty of vegetables, fruits, low-fat dairy products, and lean protein.  Do not eat a lot of foods that are high in solid fats, added sugars, or sodium. Maintain a healthy weight Body mass index (BMI) is used to identify weight problems. It estimates body fat based on height and weight. Your health care provider can help determine your BMI and help you achieve or maintain a healthy weight. Get regular exercise Get regular exercise. This is one of the most important things you can do for your health. Most adults should:  Exercise for at least 150 minutes each week. The exercise should increase your heart rate and make you sweat (moderate-intensity exercise).  Do strengthening exercises at least twice a week. This is in addition to the moderate-intensity exercise.  Spend less time sitting. Even light physical activity can be beneficial. Watch cholesterol and blood lipids Have your blood tested for lipids and cholesterol at 46 years of age, then have this test every 5 years. Have your cholesterol levels checked more often if:  Your lipid or cholesterol levels are high.  You are older than 46 years of age.  You are at high risk for heart disease. What should I know about cancer screening? Depending on your health history and family history, you may need to have cancer screening at various ages. This may include screening for:  Breast cancer.  Cervical cancer.  Colorectal cancer.  Skin cancer.  Lung cancer. What should I know about heart disease, diabetes, and high blood  pressure? Blood pressure and heart disease  High blood pressure causes heart disease and increases the risk of stroke. This is more likely to develop in people who have high blood pressure readings, are of African descent, or are overweight.  Have your blood pressure checked: ? Every 3-5 years if you are 18-39 years of age. ? Every year if you are 40 years old or older. Diabetes Have regular diabetes screenings. This checks your fasting blood sugar level. Have the screening done:  Once every three years after age 40 if you are at a normal weight and have a low risk for diabetes.  More often and at a younger age if you are overweight or have a high risk for diabetes. What should I know about preventing infection? Hepatitis B If you have a higher risk for hepatitis B, you should be screened for this virus. Talk with your health care provider to find out if you are at risk for hepatitis B infection. Hepatitis C Testing is recommended for:  Everyone born from 1945 through 1965.  Anyone with known risk factors for hepatitis C. Sexually transmitted infections (STIs)  Get screened for STIs, including gonorrhea and chlamydia, if: ? You are sexually active and are younger than 46 years of age. ? You are older than 46 years of age and your health care provider tells you that you are at risk for this type of infection. ? Your sexual activity has changed since you were last screened, and you are at increased risk for chlamydia or gonorrhea. Ask your health care provider if   you are at risk.  Ask your health care provider about whether you are at high risk for HIV. Your health care provider may recommend a prescription medicine to help prevent HIV infection. If you choose to take medicine to prevent HIV, you should first get tested for HIV. You should then be tested every 3 months for as long as you are taking the medicine. Pregnancy  If you are about to stop having your period (premenopausal) and  you may become pregnant, seek counseling before you get pregnant.  Take 400 to 800 micrograms (mcg) of folic acid every day if you become pregnant.  Ask for birth control (contraception) if you want to prevent pregnancy. Osteoporosis and menopause Osteoporosis is a disease in which the bones lose minerals and strength with aging. This can result in bone fractures. If you are 65 years old or older, or if you are at risk for osteoporosis and fractures, ask your health care provider if you should:  Be screened for bone loss.  Take a calcium or vitamin D supplement to lower your risk of fractures.  Be given hormone replacement therapy (HRT) to treat symptoms of menopause. Follow these instructions at home: Lifestyle  Do not use any products that contain nicotine or tobacco, such as cigarettes, e-cigarettes, and chewing tobacco. If you need help quitting, ask your health care provider.  Do not use street drugs.  Do not share needles.  Ask your health care provider for help if you need support or information about quitting drugs. Alcohol use  Do not drink alcohol if: ? Your health care provider tells you not to drink. ? You are pregnant, may be pregnant, or are planning to become pregnant.  If you drink alcohol: ? Limit how much you use to 0-1 drink a day. ? Limit intake if you are breastfeeding.  Be aware of how much alcohol is in your drink. In the [place] one drink equals one 12 oz bottle of beer (355 mL), one 5 oz glass of wine (148 mL), or one 1 oz glass of hard liquor (44 mL). General instructions  Schedule regular health, dental, and eye exams.  Stay current with your vaccines.  Tell your health care provider if: ? You often feel depressed. ? You have ever been abused or do not feel safe at home. Summary  Adopting a healthy lifestyle and getting preventive care are important in promoting health and wellness.  Follow your health care provider's instructions about healthy  diet, exercising, and getting tested or screened for diseases.  Follow your health care provider's instructions on monitoring your cholesterol and blood pressure. This information is not intended to replace advice given to you by your health care provider. Make sure you discuss any questions you have with your health care provider. Document Revised: 01/02/2018 Document Reviewed: 01/02/2018 Elsevier Patient Education  2020 Elsevier Inc.  

## 2019-12-08 NOTE — Assessment & Plan Note (Signed)
She will continue Meloxicam CMET Today She will reach back out to rheumatology to follow up on Humira

## 2019-12-08 NOTE — Progress Notes (Signed)
Subjective:    Patient ID: Jenny Castillo, female    DOB: Apr 15, 1975, 44 y.o.   MRN: [id number]  HPI  Pt presents to the clinic today for her annual exam. She is also due to follow up chronic conditions.  Acquired Hypothyroidism: She denies any issues on her current dose of Levothyroxine. She does not follow with endocrinology.  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 She does not follow with hematology.  Ankylosing Spondylitis: She is taking Meloxicam as prescribed. She is prescribed Humira but reports she is not currently this.  She follows with rhuematology.   Flu: 09/2019 Tetanus: unsure Covid: never Pap Smear: 11/2015 Mammogram: 02/2019 Colon Screening: never Vision Screening: annually Dentist: biannually  Diet: She does eat meat. She consumes fruits and veggies daily. She tries to avoid fried foods. She drinks mostly water, coffee, iced tea. Exercise: Walking  Review of Systems  Past Medical History:  Diagnosis Date  . Abnormal white blood cell (WBC)    low; white blood cell disorder  . Chronic fatigue   . Galactorrhea   . Immune neutropenia (HCC)   . Thyroid nodule     Current Outpatient Medications  Medication Sig Dispense Refill  . ibuprofen (ADVIL,MOTRIN) 200 MG tablet Take 600 mg by mouth every morning.    [name] levonorgestrel (MIRENA) 20 MCG/24HR IUD 1 each by Intrauterine route once. Inserted 01/2016    . levothyroxine (SYNTHROID) 25 MCG tablet Take 1 tablet (25 mcg total) by mouth daily before breakfast. 30 tablet 0  . meloxicam (MOBIC) 15 MG tablet TAKE 1 TABLET BY MOUTH EVERY DAY 30 tablet 2  . omeprazole (PRILOSEC) 40 MG capsule Take 1 capsule (40 mg total) by mouth daily. MUST SCHEDULE PHYSICAL 90 capsule 0   No current facility-administered medications for this visit.    Allergies  Allergen Reactions  . Bactrim [Sulfamethoxazole-Trimethoprim] Other (See Comments)    Has a low blood count has to stay away from certain medications   . Ceftin  [Cefuroxime Axetil] Other (See Comments)  . Reglan [Metoclopramide] Other (See Comments)    Heavy pressure in chest , can't breathe  . Sulfa Antibiotics Other (See Comments)  . Rocephin [Ceftriaxone Sodium In Dextrose] Rash    Swelling     Family History  Problem Relation Age of Onset  . Cancer Mother 25       kidney-renal cell  . Stroke Father   . Hypertension Father   . Asthma Maternal Grandmother   . Hypertension Maternal Grandmother   . Stroke Paternal Grandmother   . Breast cancer Neg Hx     Social History   Socioeconomic History  . Marital status: Married    Spouse name: Not on file  . Number of children: 3  . Years of education: 57  . Highest education level: Not on file  Occupational History  . Occupation: insurance  Tobacco Use  . Smoking status: Former Games developer  . Smokeless tobacco: Never Used  Vaping Use  . Vaping Use: Never used  Substance and Sexual Activity  . Alcohol use: Yes    Alcohol/week: 0.0 standard drinks  . Drug use: No  . Sexual activity: Yes  Other Topics Concern  . Not on file  Social History Narrative  . Not on file   Social Determinants of Health   Financial Resource Strain:   . Difficulty of Paying Living Expenses: Not on file  Food Insecurity:   . Worried About Programme researcher, broadcasting/film/video in the Last Year:  Not on file  . Ran Out of Food in the Last Year: Not on file  Transportation Needs:   . Lack of Transportation (Medical): Not on file  . Lack of Transportation (Non-Medical): Not on file  Physical Activity:   . Days of Exercise per Week: Not on file  . Minutes of Exercise per Session: Not on file  Stress:   . Feeling of Stress : Not on file  Social Connections:   . Frequency of Communication with Friends and Family: Not on file  . Frequency of Social Gatherings with Friends and Family: Not on file  . Attends Religious Services: Not on file  . Active Member of Clubs or Organizations: Not on file  . Attends Banker  Meetings: Not on file  . Marital Status: Not on file  Intimate Partner Violence:   . Fear of Current or Ex-Partner: Not on file  . Emotionally Abused: Not on file  . Physically Abused: Not on file  . Sexually Abused: Not on file     Constitutional: Denies fever, malaise, fatigue, headache or abrupt weight changes.  HEENT: Denies eye pain, eye redness, ear pain, ringing in the ears, wax buildup, runny nose, nasal congestion, bloody nose, or sore throat. Respiratory: Denies difficulty breathing, shortness of breath, cough or sputum production.   Cardiovascular: Denies chest pain, chest tightness, palpitations or swelling in the hands or feet.  Gastrointestinal: Denies abdominal pain, bloating, constipation, diarrhea or blood in the stool.  GU: Denies urgency, frequency, pain with urination, burning sensation, blood in urine, odor or discharge. Musculoskeletal: Pt reports intermittent joint pains. Denies decrease in range of motion, difficulty with gait, muscle pain or joint swelling.  Skin: Pt reports easy bruising. Denies redness, rashes, lesions or ulcercations.  Neurological: Denies dizziness, difficulty with memory, difficulty with speech or problems with balance and coordination.  Psych: Denies anxiety, depression, SI/HI.  No other specific complaints in a complete review of systems (except as listed in HPI above).     Objective:   Physical Exam   BP 122/70   Pulse 76   Temp 98.3 F (36.8 C) (Temporal)   Ht 5' 3.5" (1.613 m)   Wt 179 lb (81.2 kg)   SpO2 99%   BMI 31.21 kg/m   Wt Readings from Last 3 Encounters:  02/13/19 168 lb (76.2 kg)  10/29/18 167 lb (75.8 kg)  09/24/18 167 lb (75.8 kg)    General: Appears her stated age, well developed, well nourished in NAD. Skin: Warm, dry and intact. Scattered bruises noted of BUE. HEENT: Head: normal shape and size; Eyes: sclera white, no icterus, conjunctiva pink, PERRLA and EOMs intact;  Neck:  Neck supple, trachea  midline. No masses, lumps or thyromegaly present.  Cardiovascular: Normal rate and rhythm. S1,S2 noted.  No murmur, rubs or gallops noted. No JVD or BLE edema. No carotid bruits noted. Pulmonary/Chest: Normal effort and positive vesicular breath sounds. No respiratory distress. No wheezes, rales or ronchi noted.  Abdomen: Soft and nontender. Normal bowel sounds. No distention or masses noted. Liver, spleen and kidneys non palpable. Musculoskeletal: Strength 5/5 BUE/BLE. No signs of joint swelling. No difficulty with gait.  Neurological: Alert and oriented. Cranial nerves II-XII grossly intact. Coordination normal.  Psychiatric: Mood and affect normal. Behavior is normal. Judgment and thought content normal.    BMET    Component Value Date/Time   NA 138 02/13/2019 1038   K 4.0 02/13/2019 1038   CL 102 02/13/2019 1038   CO2  29 02/13/2019 1038   GLUCOSE 93 02/13/2019 1038   BUN 11 02/13/2019 1038   CREATININE 0.84 02/13/2019 1038   CREATININE 0.87 12/14/2017 1502   CALCIUM 9.5 02/13/2019 1038    Lipid Panel     Component Value Date/Time   CHOL 158 12/14/2017 1502   TRIG 124 12/14/2017 1502   HDL 43 (L) 12/14/2017 1502   CHOLHDL 3.7 12/14/2017 1502   VLDL 14.4 12/12/2016 1004   LDLCALC 92 12/14/2017 1502    CBC    Component Value Date/Time   WBC 3.1 (L) 02/13/2019 1038   RBC 4.55 02/13/2019 1038   HGB 14.3 02/13/2019 1038   HCT 42.2 02/13/2019 1038   PLT 189.0 02/13/2019 1038   MCV 92.6 02/13/2019 1038   MCH 31.1 12/14/2017 1502   MCHC 33.8 02/13/2019 1038   RDW 12.1 02/13/2019 1038   LYMPHSABS 1.2 08/09/2018 1424   MONOABS 0.4 08/09/2018 1424   EOSABS 0.1 08/09/2018 1424   BASOSABS 0.0 08/09/2018 1424    Hgb A1C Lab Results  Component Value Date   HGBA1C 5.7 12/20/2016           Assessment & Plan:   Preventative Health Maintenance:  Flu shot UTD She declines tetanus today She declines Covid vaccine Pap smear due next year Mammogram UTD Referral to  GI for screening colonoscopy Encouraged her to consume a balanced diet and exercise regimen Advised her to see an eye doctor and dentist annually Will check CBC, CMET, Lipid, and Vit D today  Easy Bruising:  IBC panel today  RTC in 1 year, sooner if needed Nicki Reaper, NP This visit occurred during the SARS-CoV-2 public health emergency.  Safety protocols were in place, including screening questions prior to the visit, additional usage of staff PPE, and extensive cleaning of exam room while observing appropriate contact time as indicated for disinfecting solutions.

## 2019-12-08 NOTE — Assessment & Plan Note (Signed)
TSH and Free T4 today Will adjust Levothyroxine if needed based on labs 

## 2019-12-09 LAB — LIPID PANEL
Cholesterol: 130 mg/dL (ref 0–200)
HDL: 43 mg/dL (ref >39.00)
LDL Cholesterol: 72 mg/dL (ref 0–99)
NonHDL: 86.73
Total CHOL/HDL Ratio: 3
Triglycerides: 72 mg/dL (ref 0.0–149.0)
VLDL: 14.4 mg/dL (ref 0.0–40.0)

## 2019-12-09 LAB — CBC
HCT: 39.6 % (ref 36.0–46.0)
Hemoglobin: 13.4 g/dL (ref 12.0–15.0)
MCHC: 33.9 g/dL (ref 30.0–36.0)
MCV: 91.6 fl (ref 78.0–100.0)
Platelets: 192 10*3/uL (ref 150.0–400.0)
RBC: 4.32 Mil/uL (ref 3.87–5.11)
RDW: 12.7 % (ref 11.5–15.5)
WBC: 2.9 10*3/uL — ABNORMAL LOW (ref 4.0–10.5)

## 2019-12-09 LAB — COMPREHENSIVE METABOLIC PANEL
ALT: 14 U/L (ref 0–35)
AST: 20 U/L (ref 0–37)
Albumin: 4.5 g/dL (ref 3.5–5.2)
Alkaline Phosphatase: 46 U/L (ref 39–117)
BUN: 9 mg/dL (ref 6–23)
CO2: 27 mEq/L (ref 19–32)
Calcium: 9.1 mg/dL (ref 8.4–10.5)
Chloride: 100 mEq/L (ref 96–112)
Creatinine, Ser: 0.76 mg/dL (ref 0.40–1.20)
GFR: 94.22 mL/min (ref >60.00)
Glucose, Bld: 90 mg/dL (ref 70–99)
Potassium: 4 mEq/L (ref 3.5–5.1)
Sodium: 136 mEq/L (ref 135–145)
Total Bilirubin: 0.3 mg/dL (ref 0.2–1.2)
Total Protein: 7.5 g/dL (ref 6.0–8.3)

## 2019-12-09 LAB — IBC PANEL
Iron: 98 ug/dL (ref 42–145)
Saturation Ratios: 32.1 % (ref 20.0–50.0)
Transferrin: 218 mg/dL (ref 212.0–360.0)

## 2019-12-09 LAB — T4, FREE: Free T4: 0.92 ng/dL (ref 0.60–1.60)

## 2019-12-09 LAB — TSH: TSH: 1.77 u[IU]/mL (ref 0.35–4.50)

## 2019-12-09 LAB — VITAMIN D 25 HYDROXY (VIT D DEFICIENCY, FRACTURES): VITD: 30.65 ng/mL (ref 30.00–100.00)

## 2019-12-24 ENCOUNTER — Encounter: Payer: Self-pay | Admitting: Internal Medicine

## 2019-12-26 ENCOUNTER — Encounter: Payer: Self-pay | Admitting: Internal Medicine

## 2019-12-26 ENCOUNTER — Ambulatory Visit: Payer: Commercial Managed Care - PPO | Admitting: Internal Medicine

## 2019-12-26 ENCOUNTER — Other Ambulatory Visit: Payer: Self-pay

## 2019-12-26 VITALS — BP 126/84 | HR 82 | Temp 97.0°F | Wt 159.0 lb

## 2019-12-26 DIAGNOSIS — R590 Localized enlarged lymph nodes: Secondary | ICD-10-CM | POA: Diagnosis not present

## 2019-12-26 NOTE — Patient Instructions (Signed)

## 2019-12-26 NOTE — Progress Notes (Signed)
Subjective:    Patient ID: Jenny Castillo, female    DOB: [birth year], 44 y.o.   MRN: [id number]  HPI  Pt presents to the clinic today with c/o enlarged lymph node in her right axilla. She noticed this 4 days ago. She did not recently get a Covid vaccine. Her last mammogram was 02/2019. 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  Review of Systems      Past Medical History:  Diagnosis Date  . Abnormal white blood cell (WBC)    low; white blood cell disorder  . Chronic fatigue   . Galactorrhea   . Immune neutropenia (HCC)   . Thyroid nodule     Current Outpatient Medications  Medication Sig Dispense Refill  . HUMIRA PEN 40 MG/0.4ML PNKT SMARTSIG:40 Milligram(s) SUB-Q Every 2 Weeks (Patient not taking: Reported on 12/08/2019)    . ibuprofen (ADVIL,MOTRIN) 200 MG tablet Take 600 mg by mouth every morning.    [name] levonorgestrel (MIRENA) 20 MCG/24HR IUD 1 each by Intrauterine route once. Inserted 01/2016    . levothyroxine (SYNTHROID) 25 MCG tablet Take 1 tablet (25 mcg total) by mouth daily before breakfast. 30 tablet 0  . meloxicam (MOBIC) 15 MG tablet TAKE 1 TABLET BY MOUTH EVERY DAY (Patient taking differently: Take 15 mg by mouth as needed. ) 30 tablet 2   No current facility-administered medications for this visit.    Allergies  Allergen Reactions  . Bactrim [Sulfamethoxazole-Trimethoprim] Other (See Comments)    Has a low blood count has to stay away from certain medications   . Ceftin [Cefuroxime Axetil] Other (See Comments)  . Reglan [Metoclopramide] Other (See Comments)    Heavy pressure in chest , can't breathe  . Sulfa Antibiotics Other (See Comments)  . Rocephin [Ceftriaxone Sodium In Dextrose] Rash    Swelling     Family History  Problem Relation Age of Onset  . Cancer Mother 70       kidney-renal cell  . Stroke Father   . Hypertension Father   . Asthma Maternal Grandmother   . Hypertension Maternal Grandmother   . Stroke Paternal Grandmother   .  Breast cancer Neg Hx     Social History   Socioeconomic History  . Marital status: Married    Spouse name: Not on file  . Number of children: 3  . Years of education: 65  . Highest education level: Not on file  Occupational History  . Occupation: insurance  Tobacco Use  . Smoking status: Former Games developer  . Smokeless tobacco: Never Used  Vaping Use  . Vaping Use: Never used  Substance and Sexual Activity  . Alcohol use: Yes    Alcohol/week: 0.0 standard drinks  . Drug use: No  . Sexual activity: Yes  Other Topics Concern  . Not on file  Social History Narrative  . Not on file   Social Determinants of Health   Financial Resource Strain:   . Difficulty of Paying Living Expenses: Not on file  Food Insecurity:   . Worried About Programme researcher, broadcasting/film/video in the Last Year: Not on file  . Ran Out of Food in the Last Year: Not on file  Transportation Needs:   . Lack of Transportation (Medical): Not on file  . Lack of Transportation (Non-Medical): Not on file  Physical Activity:   . Days of Exercise per Week: Not on file  . Minutes of Exercise per Session: Not on file  Stress:   . Feeling of  Stress : Not on file  Social Connections:   . Frequency of Communication with Friends and Family: Not on file  . Frequency of Social Gatherings with Friends and Family: Not on file  . Attends Religious Services: Not on file  . Active Member of Clubs or Organizations: Not on file  . Attends Banker Meetings: Not on file  . Marital Status: Not on file  Intimate Partner Violence:   . Fear of Current or Ex-Partner: Not on file  . Emotionally Abused: Not on file  . Physically Abused: Not on file  . Sexually Abused: Not on file     Constitutional: Denies fever, malaise, fatigue, headache or abrupt weight changes.  Respiratory: Denies difficulty breathing, shortness of breath, cough or sputum production.   Cardiovascular: Denies chest pain, chest tightness, palpitations or swelling  in the hands or feet.  Skin: Pt reports enlarged axillary lymph node. Denies redness, rashes, lesions or ulcercations.   No other specific complaints in a complete review of systems (except as listed in HPI above).  Objective:   Physical Exam   BP 126/84   Pulse 82   Temp (!) 97 F (36.1 C) (Temporal)   Wt 159 lb (72.1 kg)   SpO2 98%   BMI 27.72 kg/m   Wt Readings from Last 3 Encounters:  12/08/19 179 lb (81.2 kg)  02/13/19 168 lb (76.2 kg)  10/29/18 167 lb (75.8 kg)    General: Appears her stated age, well developed, well nourished in NAD. Skin: 2 cm mobile, smooth lymph node noted in the right axilla. Breast symmetrical without masses or lumps. No discharge noted from the nipples. Cardiovascular: Normal rate. Pulmonary/Chest: Normal effort. Neurological: Alert and oriented.   BMET    Component Value Date/Time   NA 136 12/08/2019 1519   K 4.0 12/08/2019 1519   CL 100 12/08/2019 1519   CO2 27 12/08/2019 1519   GLUCOSE 90 12/08/2019 1519   BUN 9 12/08/2019 1519   CREATININE 0.76 12/08/2019 1519   CREATININE 0.87 12/14/2017 1502   CALCIUM 9.1 12/08/2019 1519    Lipid Panel     Component Value Date/Time   CHOL 130 12/08/2019 1519   TRIG 72.0 12/08/2019 1519   HDL 43.00 12/08/2019 1519   CHOLHDL 3 12/08/2019 1519   VLDL 14.4 12/08/2019 1519   LDLCALC 72 12/08/2019 1519   LDLCALC 92 12/14/2017 1502    CBC    Component Value Date/Time   WBC 2.9 (L) 12/08/2019 1519   RBC 4.32 12/08/2019 1519   HGB 13.4 12/08/2019 1519   HCT 39.6 12/08/2019 1519   PLT 192.0 12/08/2019 1519   MCV 91.6 12/08/2019 1519   MCH 31.1 12/14/2017 1502   MCHC 33.9 12/08/2019 1519   RDW 12.7 12/08/2019 1519   LYMPHSABS 1.2 08/09/2018 1424   MONOABS 0.4 08/09/2018 1424   EOSABS 0.1 08/09/2018 1424   BASOSABS 0.0 08/09/2018 1424    Hgb A1C Lab Results  Component Value Date   HGBA1C 5.7 12/20/2016          Assessment & Plan:    Nicki Reaper, NP This visit occurred  during the SARS-CoV-2 public health emergency.  Safety protocols were in place, including screening questions prior to the visit, additional usage of staff PPE, and extensive cleaning of exam room while observing appropriate contact time as indicated for disinfecting solutions.

## 2020-01-05 ENCOUNTER — Other Ambulatory Visit: Payer: Self-pay | Admitting: Internal Medicine

## 2020-01-06 ENCOUNTER — Other Ambulatory Visit: Payer: Self-pay

## 2020-01-06 ENCOUNTER — Ambulatory Visit
Admission: RE | Admit: 2020-01-06 | Discharge: 2020-01-06 | Disposition: A | Discharge status: Home / Self Care | Payer: Commercial Managed Care - PPO | Source: Ambulatory Visit | Attending: Internal Medicine | Admitting: Internal Medicine

## 2020-01-06 DIAGNOSIS — R590 Localized enlarged lymph nodes: Secondary | ICD-10-CM

## 2020-01-07 ENCOUNTER — Telehealth: Payer: Self-pay | Admitting: Internal Medicine

## 2020-01-07 DIAGNOSIS — D709 Neutropenia, unspecified: Secondary | ICD-10-CM

## 2020-01-07 DIAGNOSIS — R590 Localized enlarged lymph nodes: Secondary | ICD-10-CM

## 2020-01-07 NOTE — Addendum Note (Signed)
Addended by: Lorre Munroe on: 01/07/2020 01:47 PM   Modules accepted: Orders

## 2020-01-07 NOTE — Telephone Encounter (Signed)
Referral placed.

## 2020-01-07 NOTE — Telephone Encounter (Signed)
Pt called in due to she had the mammogram and ultrasound yesterday 01/06/20 and the radiologist referred her to a hematologist due to the swollen lympnodes was not breast related and she called [facility] needs a refferal and doctors notes and they can set up the appointment.

## 2020-01-07 NOTE — Telephone Encounter (Signed)
Spoke to pt and she stated the radiologist told her that she needed to go to Hematology and did not place a referral... pt called Oncology and they told her that she will need a referral from PCP... so NO referral has been placed at this time... Radiologist wants her to f/u with them in 3 months after being evaluated by Hematology

## 2020-01-07 NOTE — Telephone Encounter (Signed)
It sounds like the radiologist referred her to hematology. Does she also need a new referral from me?

## 2020-01-19 ENCOUNTER — Inpatient Hospital Stay: Payer: Commercial Managed Care - PPO

## 2020-01-19 ENCOUNTER — Encounter: Payer: Self-pay | Admitting: Oncology

## 2020-01-19 ENCOUNTER — Inpatient Hospital Stay: Payer: Commercial Managed Care - PPO | Attending: Oncology | Admitting: Oncology

## 2020-01-19 ENCOUNTER — Telehealth: Payer: Self-pay

## 2020-01-19 ENCOUNTER — Other Ambulatory Visit: Payer: Self-pay

## 2020-01-19 VITALS — BP 131/84 | HR 69 | Temp 97.0°F | Resp 16 | Wt 159.8 lb

## 2020-01-19 DIAGNOSIS — R591 Generalized enlarged lymph nodes: Secondary | ICD-10-CM | POA: Insufficient documentation

## 2020-01-19 DIAGNOSIS — Z8051 Family history of malignant neoplasm of kidney: Secondary | ICD-10-CM | POA: Insufficient documentation

## 2020-01-19 DIAGNOSIS — D709 Neutropenia, unspecified: Secondary | ICD-10-CM

## 2020-01-19 DIAGNOSIS — Z87891 Personal history of nicotine dependence: Secondary | ICD-10-CM | POA: Insufficient documentation

## 2020-01-19 LAB — CBC WITH DIFFERENTIAL/PLATELET
Abs Immature Granulocytes: 0.01 10*3/uL (ref 0.00–0.07)
Band Neutrophils: 0 %
Basophils Absolute: 0 10*3/uL (ref 0.0–0.1)
Basophils Relative: 1 %
Blasts: 0 %
Eosinophils Absolute: 0.1 10*3/uL (ref 0.0–0.5)
Eosinophils Relative: 2 %
HCT: 39.5 % (ref 36.0–46.0)
Hemoglobin: 13.5 g/dL (ref 12.0–15.0)
Immature Granulocytes: 0 %
Lymphocytes Relative: 26 %
Lymphs Abs: 0.8 10*3/uL (ref 0.7–4.0)
MCH: 30.9 pg (ref 26.0–34.0)
MCHC: 34.2 g/dL (ref 30.0–36.0)
MCV: 90.4 fL (ref 80.0–100.0)
Metamyelocytes Relative: 0 %
Monocytes Absolute: 0.5 10*3/uL (ref 0.1–1.0)
Monocytes Relative: 18 %
Myelocytes: 0 %
Neutro Abs: 1.6 10*3/uL — ABNORMAL LOW (ref 1.7–7.7)
Neutrophils Relative %: 52 %
Other: 0 %
Platelets: 163 10*3/uL (ref 150–400)
Promyelocytes Relative: 0 %
RBC: 4.37 MIL/uL (ref 3.87–5.11)
RDW: 11.9 % (ref 11.5–15.5)
WBC: 3 10*3/uL — ABNORMAL LOW (ref 4.0–10.5)
nRBC: 0 % (ref 0.0–0.2)
nRBC: 0 /100 WBC

## 2020-01-19 LAB — COMPREHENSIVE METABOLIC PANEL
ALT: 22 U/L (ref 0–44)
AST: 25 U/L (ref 15–41)
Albumin: 4.3 g/dL (ref 3.5–5.0)
Alkaline Phosphatase: 42 U/L (ref 38–126)
Anion gap: 10 (ref 5–15)
BUN: 6 mg/dL (ref 6–20)
CO2: 24 mmol/L (ref 22–32)
Calcium: 8.8 mg/dL — ABNORMAL LOW (ref 8.9–10.3)
Chloride: 101 mmol/L (ref 98–111)
Creatinine, Ser: 0.61 mg/dL (ref 0.44–1.00)
GFR, Estimated: >60 mL/min (ref >60)
Glucose, Bld: 89 mg/dL (ref 70–99)
Potassium: 3.6 mmol/L (ref 3.5–5.1)
Sodium: 135 mmol/L (ref 135–145)
Total Bilirubin: 0.5 mg/dL (ref 0.3–1.2)
Total Protein: 8 g/dL (ref 6.5–8.1)

## 2020-01-19 LAB — SEDIMENTATION RATE: Sed Rate: 3 mm/hr (ref 0–22)

## 2020-01-19 LAB — TECHNOLOGIST SMEAR REVIEW
Plt Morphology: NORMAL
RBC Morphology: NORMAL

## 2020-01-19 LAB — C-REACTIVE PROTEIN: CRP: 0.6 mg/dL (ref <1.0)

## 2020-01-19 LAB — LACTATE DEHYDROGENASE: LDH: 120 U/L (ref 98–192)

## 2020-01-19 NOTE — Telephone Encounter (Signed)
Patient reports previous oncological work up at [facility] at least 10 years ago which is now [facility].    Signed records request faxed to [phone] and [phone].  Also called [phone] informing them about requested records from 10 years ago and to call [facility] clinical team desk phone with any questions.

## 2020-01-19 NOTE — Progress Notes (Signed)
New patient evaluation.   

## 2020-01-19 NOTE — Progress Notes (Signed)
Hematology/Oncology Consult note [facility] Telephone:([phone] [phone]   Patient Care Team: Jearld Fenton, NP as PCP - General (Internal Medicine)  REFERRING PROVIDER: Jearld Fenton, NP  CHIEF COMPLAINTS/REASON FOR VISIT:  Evaluation of right axillary lymphadenopathy  HISTORY OF PRESENTING ILLNESS:   Jenny Castillo is a  44 y.o.  female with PMH listed below was seen in consultation at the request of  Jearld Fenton, NP  for evaluation of right axillary lymphadenopathy  11/30/2017, bilateral screening mammogram recommend ultrasound of the right axilla for further evaluation of right axillary mass.  Patient underwent ultrasound evaluation which showed 3.7 cm right axillary lymph node and ultrasound-guided biopsy of the prominent right axillary lymph node.  Pathology showed fragments of benign reactive appearing lymph node with reactive follicular lymphoid hyperplasia.  No evidence of malignancy.  01/06/2020, patient had a repeat diagnostic mammogram bilaterally as well as ultrasound which again showed right axillary lymphadenopathy, stable cortical thickness 1.1 cm comparing to 1 cm in November 2019.  A couple other prominent adjacent lymph nodes.  Patient was recommended to establish care with hematology oncology for further evaluation   Patient reports history of right axillary mass for which she underwent right axillary lymph node dissection in 2001 in city of [place].  Patient results were benign.  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  Patient has a diagnosis of seronegative spondyloarthropathy for which she was previously following up with [facility] rheumatology Dr. Denyse Amass.  Patient was last seen on 03/24/2019.  Patient has had Humira treatment which she feels helpful for her elbow symptoms.  She has lost follow-up since then as her rheumatologist have left the practice.  She was also previously seen by [facility]  clinic rheumatology Dr. Meda Coffee who has also left the practice.  Patient denies any family history of breast cancer.Denies weight loss, fever, chills, fatigue, night sweats.  Occasionally she has experienced some hot flash which she attributes to perimenopausal symptoms. She has a history of IUD placement. Patient also has had bone marrow biopsy done in 2002 with the results scanned into epic's on 12/31/2015 50% cellularity, megakaryocytes are present in normal numbers and are morphologically unremarkable.  No aggregates or sheets of immature cells are identified and the lymphocytes do not appear to be increased in numbers.  Hematopoietic marrow consist of maturing myeloid and erythroid precursors. There was presence of CD19/CD10 coexpressing cells which were considered to be immature bones rather than blast or acute lymphoblastic leukemia.    Review of Systems  Constitutional: Negative for appetite change, chills, fatigue and fever.  HENT:   Negative for hearing loss and voice change.   Eyes: Negative for eye problems.  Respiratory: Negative for chest tightness and cough.   Cardiovascular: Negative for chest pain.  Gastrointestinal: Negative for abdominal distention, abdominal pain and blood in stool.  Endocrine: Negative for hot flashes.  Genitourinary: Negative for difficulty urinating and frequency.   Musculoskeletal: Negative for arthralgias.  Skin: Negative for itching and rash.  Neurological: Negative for extremity weakness.  Hematological: Positive for adenopathy.  Psychiatric/Behavioral: Negative for confusion.    MEDICAL HISTORY:  Past Medical History:  Diagnosis Date  . Abnormal white blood cell (WBC)    low; white blood cell disorder  . Ankylosing spondylitis ([place])   . Chronic fatigue   . Galactorrhea   . 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 Thyroid nodule     SURGICAL HISTORY: Past Surgical History:  Procedure Laterality  Date  . AXILLARY LYMPH NODE BIOPSY Right 2001    benign mass  . BONE MARROW BIOPSY    . BREAST BIOPSY Right 12/07/2017   BENIGN, REACTIVE APPEARING LYMPH NODE WITH REACTIVE   . CESAREAN SECTION  2004; 2010  . WISDOM TOOTH EXTRACTION      SOCIAL HISTORY: Social History   Socioeconomic History  . Marital status: Married    Spouse name: Not on file  . Number of children: 3  . Years of education: 69  . Highest education level: Not on file  Occupational History  . Occupation: insurance  Tobacco Use  . Smoking status: Former Smoker    Quit date: 01/18/1997    Years since quitting: 23.0  . Smokeless tobacco: Never Used  Vaping Use  . Vaping Use: Never used  Substance and Sexual Activity  . Alcohol use: Yes    Alcohol/week: 0.0 standard drinks  . Drug use: No  . Sexual activity: Yes  Other Topics Concern  . Not on file  Social History Narrative  . Not on file   Social Determinants of Health   Financial Resource Strain: Not on file  Food Insecurity: Not on file  Transportation Needs: Not on file  Physical Activity: Not on file  Stress: Not on file  Social Connections: Not on file  Intimate Partner Violence: Not on file    FAMILY HISTORY: Family History  Problem Relation Age of Onset  . Cancer Mother 73       kidney-renal cell  . Stroke Father   . Hypertension Father   . Asthma Maternal Grandmother   . Hypertension Maternal Grandmother   . Stroke Paternal Grandmother   . Breast cancer Neg Hx     ALLERGIES:  is allergic to bactrim [sulfamethoxazole-trimethoprim], ceftin [cefuroxime axetil], reglan [metoclopramide], sulfa antibiotics, and rocephin [ceftriaxone sodium in dextrose].  MEDICATIONS:  Current Outpatient Medications  Medication Sig Dispense Refill  . ibuprofen (ADVIL,MOTRIN) 200 MG tablet Take 600 mg by mouth every morning.    [name] levonorgestrel (MIRENA) 20 MCG/24HR IUD 1 each by Intrauterine route once. Inserted 01/2016    . levothyroxine (SYNTHROID) 25 MCG tablet TAKE 1 TABLET BY MOUTH DAILY BEFORE  BREAKFAST. 90 tablet 2  . meloxicam (MOBIC) 15 MG tablet TAKE 1 TABLET BY MOUTH EVERY DAY (Patient taking differently: Take 15 mg by mouth as needed.) 30 tablet 2  . phentermine (ADIPEX-P) 37.5 MG tablet Take 37.5 mg by mouth daily.    [name] HUMIRA PEN 40 MG/0.4ML PNKT SMARTSIG:40 Milligram(s) SUB-Q Every 2 Weeks (Patient not taking: No sig reported)     No current facility-administered medications for this visit.     PHYSICAL EXAMINATION: ECOG PERFORMANCE STATUS: 0 - Asymptomatic Vitals:   01/19/20 1007  BP: 131/84  Pulse: 69  Resp: 16  Temp: (!) 97 F (36.1 C)   Filed Weights   01/19/20 1007  Weight: 159 lb 12.8 oz (72.5 kg)    Physical Exam Constitutional:      General: She is not in acute distress. HENT:     Head: Normocephalic and atraumatic.  Eyes:     General: No scleral icterus. Cardiovascular:     Rate and Rhythm: Normal rate and regular rhythm.     Heart sounds: Normal heart sounds.  Pulmonary:     Effort: Pulmonary effort is normal. No respiratory distress.     Breath sounds: No wheezing.  Abdominal:     General: Bowel sounds are normal. There is no distension.  Palpations: Abdomen is soft.  Musculoskeletal:        General: No deformity. Normal range of motion.     Cervical back: Normal range of motion and neck supple.  Skin:    General: Skin is warm and dry.     Findings: No erythema or rash.  Neurological:     Mental Status: She is alert and oriented to person, place, and time. Mental status is at baseline.     Cranial Nerves: No cranial nerve deficit.     Coordination: Coordination normal.  Psychiatric:        Mood and Affect: Mood normal.   Palpable right axillary lymphadenopathy  LABORATORY DATA:  I have reviewed the data as listed Lab Results  Component Value Date   WBC 3.0 (L) 01/19/2020   HGB 13.5 01/19/2020   HCT 39.5 01/19/2020   MCV 90.4 01/19/2020   PLT 163 01/19/2020   Recent Labs    02/13/19 1038 12/08/19 1519 01/19/20 1141   NA 138 136 135  K 4.0 4.0 3.6  CL 102 100 101  CO2 29 27 24   GLUCOSE 93 90 89  BUN 11 9 6   CREATININE 0.84 0.76 0.61  CALCIUM 9.5 9.1 8.8*  GFRNONAA  --   --  >60  PROT 8.0 7.5 8.0  ALBUMIN 4.7 4.5 4.3  AST 18 20 25   ALT 15 14 22   ALKPHOS 52 46 42  BILITOT 0.3 0.3 0.5   Iron/TIBC/Ferritin/ %Sat    Component Value Date/Time   IRON 98 12/08/2019 1519   IRONPCTSAT 32.1 12/08/2019 1519      RADIOGRAPHIC STUDIES: I have personally reviewed the radiological images as listed and agreed with the findings in the report. US BREAST LTD UNI RIGHT INC AXILLA  Result Date: 01/06/2020 CLINICAL DATA:  44 year old female presenting for evaluation of a possible enlarging lymph node in the right axilla. She had a right axillary lymph node biopsy in November of 2019 showing fragments of benign, reactive appearing lymph node with reactive follicular lymphoid hyperplasia. No evidence of metastatic tumor or malignancy. In the pathology comments, it was stated that follicular hyperplasia is a relatively nonspecific finding, but may be seen in the setting of autoimmune disease or certain infections. The patient does have history of ankylosing spondylitis. She has had a right axillary lymph node dissection in her 12s, which was also benign. Her epic notes also states that she has history of immune neutropenia, and has had increased bruising for the past several months. EXAM: DIGITAL DIAGNOSTIC BILATERAL MAMMOGRAM WITH TOMO AND CAD; ULTRASOUND RIGHT BREAST LIMITED COMPARISON:  Previous exam(s). ACR Breast Density Category c: The breast tissue is heterogeneously dense, which may obscure small masses. FINDINGS: The enlarged lymph node with the biopsy marking clip can be partially visualized in the right axilla. Multiple surgical clips are also seen in the right axilla. No suspicious calcifications, masses or areas of distortion are seen in the bilateral breasts. Mammographic images were processed with CAD.  Ultrasound of the right axilla demonstrates that the previously biopsied lymph node demonstrates a fairly stable cortical thickness of 1.1 cm, previously 1.0 cm in November of 2019. There are a couple other prominent adjacent lymph nodes. IMPRESSION: The biopsied benign right axillary lymph node appears fairly stable in size. There are 2 other adjacent prominent lymph nodes, likely related to the patient's autoimmune issues. RECOMMENDATION: The patient has not recently seen a rheumatologist (the provider she saw previously at [facility] has left) or a hematologist for her  autoimmune issues/blood disorder. She is encouraged to seek out these specialists as soon as possible. If either of these specialist suggest repeat lymph node biopsy would be clinically useful, we will see the patient back as soon as that is necessary. Otherwise a three-month follow-up right axillary ultrasound is recommended. I have discussed the findings and recommendations with the patient. If applicable, a reminder letter will be sent to the patient regarding the next appointment. BI-RADS CATEGORY  3: Probably benign. Electronically Signed   By: Ammie Ferrier M.D.   On: 01/06/2020 15:09   MM DIAG BREAST TOMO BILATERAL  Result Date: 01/06/2020 CLINICAL DATA:  44 year old female presenting for evaluation of a possible enlarging lymph node in the right axilla. She had a right axillary lymph node biopsy in November of 2019 showing fragments of benign, reactive appearing lymph node with reactive follicular lymphoid hyperplasia. No evidence of metastatic tumor or malignancy. In the pathology comments, it was stated that follicular hyperplasia is a relatively nonspecific finding, but may be seen in the setting of autoimmune disease or certain infections. The patient does have history of ankylosing spondylitis. She has had a right axillary lymph node dissection in her 81s, which was also benign. Her epic notes also states that she has history of  immune neutropenia, and has had increased bruising for the past several months. EXAM: DIGITAL DIAGNOSTIC BILATERAL MAMMOGRAM WITH TOMO AND CAD; ULTRASOUND RIGHT BREAST LIMITED COMPARISON:  Previous exam(s). ACR Breast Density Category c: The breast tissue is heterogeneously dense, which may obscure small masses. FINDINGS: The enlarged lymph node with the biopsy marking clip can be partially visualized in the right axilla. Multiple surgical clips are also seen in the right axilla. No suspicious calcifications, masses or areas of distortion are seen in the bilateral breasts. Mammographic images were processed with CAD. Ultrasound of the right axilla demonstrates that the previously biopsied lymph node demonstrates a fairly stable cortical thickness of 1.1 cm, previously 1.0 cm in November of 2019. There are a couple other prominent adjacent lymph nodes. IMPRESSION: The biopsied benign right axillary lymph node appears fairly stable in size. There are 2 other adjacent prominent lymph nodes, likely related to the patient's autoimmune issues. RECOMMENDATION: The patient has not recently seen a rheumatologist (the provider she saw previously at [facility] has left) or a hematologist for her autoimmune issues/blood disorder. She is encouraged to seek out these specialists as soon as possible. If either of these specialist suggest repeat lymph node biopsy would be clinically useful, we will see the patient back as soon as that is necessary. Otherwise a three-month follow-up right axillary ultrasound is recommended. I have discussed the findings and recommendations with the patient. If applicable, a reminder letter will be sent to the patient regarding the next appointment. BI-RADS CATEGORY  3: Probably benign. Electronically Signed   By: Ammie Ferrier M.D.   On: 01/06/2020 15:09      ASSESSMENT & PLAN:  1. Lymphadenopathy   2. Neutropenia, unspecified type [facility])    #Recurrent right axillary lymphadenopathy Previous  images were independent reviewed and discussed with patient. She has had right axillary lymphadenopathy worked up twice in the past, remotely with axillary lymph node dissection, more recently with prominent lymphadenopathy biopsy, both did not establish any malignancy.  Lack of family history of breast cancer, negative findings on diagnostic mammogram. It is possible that lymphadenopathy is secondary to her underlying autoimmune condition. Check CBC, CMP, ANA, LDH, hepatitis, HIV, ESR, flow cytometry, smear, CRP I recommend PET  scan evaluation of activity of axillary lymphadenopathy as well as evaluation to see if any other lymphadenopathy. We will try to obtain medical records from outside facility in 2001 History of seronegative spondyloarthropathy, recommend patient to continue follow-up with rheumatology. Neutropenia, chronic, considered to be autoimmune related.   Orders Placed This Encounter  Procedures  . NM PET Image Initial (PI) Skull Base To Thigh    Standing Status:   Future    Number of Occurrences:   1    Standing Expiration Date:   01/18/2021    Order Specific Question:   If indicated for the ordered procedure, I authorize the administration of a radiopharmaceutical per Radiology protocol    Answer:   Yes    Order Specific Question:   Preferred imaging location?    Answer:   [facility]    Order Specific Question:   Radiology Contrast Protocol - do NOT remove file path    Answer:   [email or web address]    Order Specific Question:   Is the patient pregnant?    Answer:   No  . CBC with Differential/Platelet    Standing Status:   Future    Number of Occurrences:   1    Standing Expiration Date:   01/18/2021  . Comprehensive metabolic panel    Standing Status:   Future    Number of Occurrences:   1    Standing Expiration Date:   01/18/2021  . Technologist smear review    Standing Status:   Future    Number of Occurrences:   1     Standing Expiration Date:   01/18/2021  . Flow cytometry panel-leukemia/lymphoma work-up    Standing Status:   Future    Number of Occurrences:   1    Standing Expiration Date:   01/18/2021  . ANA, IFA (with reflex)    Standing Status:   Future    Number of Occurrences:   1    Standing Expiration Date:   01/18/2021  . Lactate dehydrogenase    Standing Status:   Future    Number of Occurrences:   1    Standing Expiration Date:   01/18/2021  . Hepatitis panel, acute    Standing Status:   Future    Number of Occurrences:   1    Standing Expiration Date:   01/18/2021  . HIV Antibody (routine testing w rflx)    Standing Status:   Future    Number of Occurrences:   1    Standing Expiration Date:   01/18/2021  . Sedimentation rate    Standing Status:   Future    Number of Occurrences:   1    Standing Expiration Date:   01/18/2021  . C-reactive protein    Standing Status:   Future    Number of Occurrences:   1    Standing Expiration Date:   01/18/2021    All questions were answered. The patient knows to call the clinic with any problems questions or concerns.   Jearld Fenton, NP    Return of visit: After PET scan for further evaluation. Thank you for this kind referral and the opportunity to participate in the care of this patient. A copy of today's note is routed to referring provider    Earlie Server, MD, PhD Hematology Oncology [facility] Pager- [phone] 01/19/2020

## 2020-01-20 LAB — HEPATITIS PANEL, ACUTE
HCV Ab: 0.1 s/co ratio — AB (ref 0.0–0.9)
Hep A IgM: NEGATIVE — AB
Hep B C IgM: NEGATIVE — AB
Hepatitis B Surface Ag: NEGATIVE — AB

## 2020-01-20 LAB — FANA STAINING PATTERNS: Homogeneous Pattern: 1 — ABNORMAL HIGH

## 2020-01-20 LAB — ANTINUCLEAR ANTIBODIES, IFA: ANA Ab, IFA: POSITIVE — AB

## 2020-01-20 LAB — HIV ANTIBODY (ROUTINE TESTING W REFLEX): HIV Screen 4th Generation wRfx: NONREACTIVE

## 2020-01-20 NOTE — Telephone Encounter (Signed)
Records received and scanned in media tab.

## 2020-01-21 ENCOUNTER — Telehealth: Payer: Self-pay | Admitting: *Deleted

## 2020-01-21 NOTE — Telephone Encounter (Signed)
Per Christina: Cx PET scan scan due to auth still  Pending. PET scan was cx as requested. A detailed message was left on pts VM making her aware

## 2020-01-22 LAB — COMP PANEL: LEUKEMIA/LYMPHOMA

## 2020-01-26 ENCOUNTER — Ambulatory Visit: Payer: Commercial Managed Care - PPO

## 2020-01-27 NOTE — Telephone Encounter (Signed)
Insurance Pension scheme manager notified MD that the request was denied and an urgent appeal has been filed.

## 2020-01-29 NOTE — Telephone Encounter (Signed)
Received fax with release of information request for pt to sign and give Dr. Cathie Hoops permission to initiate appeals for PET scan. Pt will come by and sign.

## 2020-01-29 NOTE — Telephone Encounter (Signed)
Form has been signed by patient and faxed to [phone]

## 2020-02-03 ENCOUNTER — Ambulatory Visit: Payer: Commercial Managed Care - PPO | Admitting: Oncology

## 2020-02-09 ENCOUNTER — Other Ambulatory Visit: Payer: Commercial Managed Care - PPO

## 2020-02-10 ENCOUNTER — Ambulatory Visit: Payer: Commercial Managed Care - PPO | Admitting: Oncology

## 2020-02-11 ENCOUNTER — Other Ambulatory Visit: Payer: Self-pay | Admitting: Oncology

## 2020-02-11 DIAGNOSIS — R768 Other specified abnormal immunological findings in serum: Secondary | ICD-10-CM

## 2020-02-12 ENCOUNTER — Telehealth: Payer: Self-pay

## 2020-02-12 NOTE — Telephone Encounter (Signed)
PET is denied. 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 and please arrange her to see me virtually 1 week after she does lab, to go over other testing.

## 2020-02-12 NOTE — Telephone Encounter (Signed)
Done.. Pt appts has been sched as requested Pt was made aware

## 2020-02-16 ENCOUNTER — Other Ambulatory Visit: Payer: Commercial Managed Care - PPO

## 2020-02-16 ENCOUNTER — Encounter: Payer: Commercial Managed Care - PPO | Admitting: Internal Medicine

## 2020-02-17 ENCOUNTER — Inpatient Hospital Stay: Payer: Commercial Managed Care - PPO

## 2020-02-17 ENCOUNTER — Other Ambulatory Visit: Payer: Self-pay

## 2020-02-17 ENCOUNTER — Inpatient Hospital Stay: Payer: Commercial Managed Care - PPO | Attending: Oncology

## 2020-02-17 DIAGNOSIS — R599 Enlarged lymph nodes, unspecified: Secondary | ICD-10-CM | POA: Insufficient documentation

## 2020-02-17 DIAGNOSIS — D709 Neutropenia, unspecified: Secondary | ICD-10-CM | POA: Insufficient documentation

## 2020-02-17 DIAGNOSIS — R768 Other specified abnormal immunological findings in serum: Secondary | ICD-10-CM

## 2020-02-19 LAB — HCV RNA BY PCR, QN RFX GENO
HCV RNA Qnt(log copy/mL): UNDETERMINED log10 IU/mL
HepC Qn: NOT DETECTED IU/mL

## 2020-02-24 ENCOUNTER — Other Ambulatory Visit: Payer: Self-pay

## 2020-02-24 ENCOUNTER — Inpatient Hospital Stay: Payer: Commercial Managed Care - PPO | Attending: Oncology | Admitting: Oncology

## 2020-02-24 ENCOUNTER — Encounter: Payer: Self-pay | Admitting: Oncology

## 2020-02-24 VITALS — BP 114/87 | HR 67 | Temp 97.4°F | Resp 18 | Wt 163.8 lb

## 2020-02-24 DIAGNOSIS — R599 Enlarged lymph nodes, unspecified: Secondary | ICD-10-CM | POA: Insufficient documentation

## 2020-02-24 DIAGNOSIS — D709 Neutropenia, unspecified: Secondary | ICD-10-CM | POA: Insufficient documentation

## 2020-02-24 DIAGNOSIS — M47819 Spondylosis without myelopathy or radiculopathy, site unspecified: Secondary | ICD-10-CM

## 2020-02-24 DIAGNOSIS — M069 Rheumatoid arthritis, unspecified: Secondary | ICD-10-CM | POA: Diagnosis not present

## 2020-02-24 DIAGNOSIS — Z87891 Personal history of nicotine dependence: Secondary | ICD-10-CM | POA: Insufficient documentation

## 2020-02-24 DIAGNOSIS — R591 Generalized enlarged lymph nodes: Secondary | ICD-10-CM

## 2020-02-24 DIAGNOSIS — M479 Spondylosis, unspecified: Secondary | ICD-10-CM | POA: Insufficient documentation

## 2020-02-24 NOTE — Progress Notes (Signed)
Patient denies new problems/concerns today.   °

## 2020-02-24 NOTE — Progress Notes (Signed)
Hematology/Oncology Consult note [facility] Telephone:([phone] [phone]   Patient Care Team: Jearld Fenton, NP as PCP - General (Internal Medicine)  REFERRING PROVIDER: Jearld Fenton, NP  CHIEF COMPLAINTS/REASON FOR VISIT:  Evaluation of right axillary lymphadenopathy  HISTORY OF PRESENTING ILLNESS:   Jenny Castillo is a  45 y.o.  female with PMH listed below was seen in consultation at the request of  Jearld Fenton, NP  for evaluation of right axillary lymphadenopathy  11/30/2017, bilateral screening mammogram recommend ultrasound of the right axilla for further evaluation of right axillary mass.  Patient underwent ultrasound evaluation which showed 3.7 cm right axillary lymph node and ultrasound-guided biopsy of the prominent right axillary lymph node.  Pathology showed fragments of benign reactive appearing lymph node with reactive follicular lymphoid hyperplasia.  No evidence of malignancy.  01/06/2020, patient had a repeat diagnostic mammogram bilaterally as well as ultrasound which again showed right axillary lymphadenopathy, stable cortical thickness 1.1 cm comparing to 1 cm in November 2019.  A couple other prominent adjacent lymph nodes.  Patient was recommended to establish care with hematology oncology for further evaluation   Patient reports history of right axillary mass for which she underwent right axillary lymph node dissection in 2001 in city of .  Patient results were benign.  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  Patient has a diagnosis of seronegative spondyloarthropathy for which she was previously following up with [facility] rheumatology Dr. Denyse Amass.  Patient was last seen on 03/24/2019.  Patient has had Humira treatment which she feels helpful for her elbow symptoms.  She has lost follow-up since then as her rheumatologist have left the practice.  She was also previously seen by [facility]  clinic rheumatology Dr. Meda Coffee who has also left the practice.  Patient denies any family history of breast cancer.Denies weight loss, fever, chills, fatigue, night sweats.  Occasionally she has experienced some hot flash which she attributes to perimenopausal symptoms. She has a history of IUD placement. Patient also has had bone marrow biopsy done in 2002 with the results scanned into epic's on 12/31/2015 50% cellularity, megakaryocytes are present in normal numbers and are morphologically unremarkable.  No aggregates or sheets of immature cells are identified and the lymphocytes do not appear to be increased in numbers.  Hematopoietic marrow consist of maturing myeloid and erythroid precursors. There was presence of CD19/CD10 coexpressing cells which were considered to be immature bones rather than blast or acute lymphoblastic leukemia.  INTERVAL HISTORY Jenny Castillo is a 45 y.o. female who has above history reviewed by me today presents for follow up visit for management of recurrent axillary lymphadenopathy Problems and complaints are listed below: Patient had blood work done during interval.  PET scan was denied by [facility].  She has no new complaints today.  Review of Systems  Constitutional: Negative for appetite change, chills, fatigue and fever.  HENT:   Negative for hearing loss and voice change.   Eyes: Negative for eye problems.  Respiratory: Negative for chest tightness and cough.   Cardiovascular: Negative for chest pain.  Gastrointestinal: Negative for abdominal distention, abdominal pain and blood in stool.  Endocrine: Negative for hot flashes.  Genitourinary: Negative for difficulty urinating and frequency.   Musculoskeletal: Negative for arthralgias.  Skin: Negative for itching and rash.  Neurological: Negative for extremity weakness.  Hematological: Positive for adenopathy.  Psychiatric/Behavioral: Negative for confusion.    MEDICAL HISTORY:  Past Medical  History:  Diagnosis Date  . Abnormal white blood cell (WBC)    low; white blood cell disorder  . Ankylosing spondylitis ([place])   . Chronic fatigue   . Galactorrhea   . 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 Thyroid nodule     SURGICAL HISTORY: Past Surgical History:  Procedure Laterality Date  . AXILLARY LYMPH NODE BIOPSY Right 2001   benign mass  . BONE MARROW BIOPSY    . BREAST BIOPSY Right 12/07/2017   BENIGN, REACTIVE APPEARING LYMPH NODE WITH REACTIVE   . CESAREAN SECTION  2004; 2010  . WISDOM TOOTH EXTRACTION      SOCIAL HISTORY: Social History   Socioeconomic History  . Marital status: Married    Spouse name: Not on file  . Number of children: 3  . Years of education: 52  . Highest education level: Not on file  Occupational History  . Occupation: insurance  Tobacco Use  . Smoking status: Former Smoker    Quit date: 01/18/1997    Years since quitting: 23.1  . Smokeless tobacco: Never Used  Vaping Use  . Vaping Use: Never used  Substance and Sexual Activity  . Alcohol use: Yes    Alcohol/week: 0.0 standard drinks  . Drug use: No  . Sexual activity: Yes  Other Topics Concern  . Not on file  Social History Narrative  . Not on file   Social Determinants of Health   Financial Resource Strain: Not on file  Food Insecurity: Not on file  Transportation Needs: Not on file  Physical Activity: Not on file  Stress: Not on file  Social Connections: Not on file  Intimate Partner Violence: Not on file    FAMILY HISTORY: Family History  Problem Relation Age of Onset  . Cancer Mother 58       kidney-renal cell  . Stroke Father   . Hypertension Father   . Asthma Maternal Grandmother   . Hypertension Maternal Grandmother   . Stroke Paternal Grandmother   . Breast cancer Neg Hx     ALLERGIES:  is allergic to bactrim [sulfamethoxazole-trimethoprim], ceftin [cefuroxime axetil], reglan [metoclopramide], sulfa antibiotics, and rocephin [ceftriaxone sodium in  dextrose].  MEDICATIONS:  Current Outpatient Medications  Medication Sig Dispense Refill  . ibuprofen (ADVIL,MOTRIN) 200 MG tablet Take 600 mg by mouth every morning.    [name] levonorgestrel (MIRENA) 20 MCG/24HR IUD 1 each by Intrauterine route once. Inserted 01/2016    . levothyroxine (SYNTHROID) 25 MCG tablet TAKE 1 TABLET BY MOUTH DAILY BEFORE BREAKFAST. 90 tablet 2  . meloxicam (MOBIC) 15 MG tablet TAKE 1 TABLET BY MOUTH EVERY DAY (Patient taking differently: Take 15 mg by mouth as needed.) 30 tablet 2  . HUMIRA PEN 40 MG/0.4ML PNKT SMARTSIG:40 Milligram(s) SUB-Q Every 2 Weeks (Patient not taking: No sig reported)    . phentermine (ADIPEX-P) 37.5 MG tablet Take 37.5 mg by mouth daily. (Patient not taking: Reported on 02/24/2020)     No current facility-administered medications for this visit.     PHYSICAL EXAMINATION: ECOG PERFORMANCE STATUS: 0 - Asymptomatic Vitals:   02/24/20 1101  BP: 114/87  Pulse: 67  Resp: 18  Temp: (!) 97.4 F (36.3 C)   Filed Weights   02/24/20 1101  Weight: 163 lb 12.8 oz (74.3 kg)    Physical Exam Constitutional:      General: She is not in acute distress. HENT:     Head: Normocephalic and atraumatic.  Eyes:     General: No scleral icterus.  Cardiovascular:     Rate and Rhythm: Normal rate and regular rhythm.     Heart sounds: Normal heart sounds.  Pulmonary:     Effort: Pulmonary effort is normal. No respiratory distress.     Breath sounds: No wheezing.  Abdominal:     General: Bowel sounds are normal. There is no distension.     Palpations: Abdomen is soft.  Musculoskeletal:        General: No deformity. Normal range of motion.     Cervical back: Normal range of motion and neck supple.  Skin:    General: Skin is warm and dry.     Findings: No erythema or rash.  Neurological:     Mental Status: She is alert and oriented to person, place, and time. Mental status is at baseline.     Cranial Nerves: No cranial nerve deficit.      Coordination: Coordination normal.  Psychiatric:        Mood and Affect: Mood normal.   Palpable right axillary lymphadenopathy  LABORATORY DATA:  I have reviewed the data as listed Lab Results  Component Value Date   WBC 3.0 (L) 01/19/2020   HGB 13.5 01/19/2020   HCT 39.5 01/19/2020   MCV 90.4 01/19/2020   PLT 163 01/19/2020   Recent Labs    12/08/19 1519 01/19/20 1141  NA 136 135  K 4.0 3.6  CL 100 101  CO2 27 24  GLUCOSE 90 89  BUN 9 6  CREATININE 0.76 0.61  CALCIUM 9.1 8.8*  GFRNONAA  --  >60  PROT 7.5 8.0  ALBUMIN 4.5 4.3  AST 20 25  ALT 14 22  ALKPHOS 46 42  BILITOT 0.3 0.5   Iron/TIBC/Ferritin/ %Sat    Component Value Date/Time   IRON 98 12/08/2019 1519   IRONPCTSAT 32.1 12/08/2019 1519      RADIOGRAPHIC STUDIES: I have personally reviewed the radiological images as listed and agreed with the findings in the report. No results found.    ASSESSMENT & PLAN:  1. Lymphadenopathy   2. Neutropenia, unspecified type ([place])   3. Spondyloarthropathy    #Recurrent right axillary lymphadenopathy Previous images were independent reviewed and discussed with patient. She has had right axillary lymphadenopathy worked up twice in the past, remotely with axillary lymph node dissection, more recently with prominent lymphadenopathy biopsy, both did not establish any malignancy.  Lack of family history of breast cancer, negative findings on diagnostic mammogram. It is most possible that lymphadenopathy is secondary to her underlying autoimmune condition.  Uncommon differential includes Castleman's disease [no splenomegaly, no constitutional symptoms] Kikuchi's disease[no constitutional symptoms], Lymphoma [no constitutional symptoms] inflammatory pseudotumor Vasculitis etc.  Labs are reviewed and discussed with patient.  Positive ANA, 1:320. Negative hepatitis and HIV panel.  Flow cytometry was negative.  LDH normal PET scan was denied by her insurance.  History  of seronegative spondyloarthropathy, she previously followed up with [facility]ologist who has [facility] her [facility]..  She agrees with establishing care with [facility] rheumatology for further evaluation.. Neutropenia, chronic, considered to be autoimmune related.   Orders Placed This Encounter  Procedures  . Ambulatory referral to Rheumatology    Referral Priority:   Routine    Referral Type:   Consultation    Referral Reason:   Specialty Services Required    Referred to Provider:   Emmaline Kluver., MD    Requested Specialty:   Rheumatology    Number of Visits Requested:   1  All questions were answered. The patient knows to call the clinic with any problems questions or concerns.   Jearld Fenton, NP    Return of visit: 6 months Thank you for this kind referral and the opportunity to participate in the care of this patient. A copy of today's note is routed to referring provider    Earlie Server, MD, PhD Hematology Oncology [facility] Pager- [phone] 02/24/2020

## 2020-04-22 ENCOUNTER — Other Ambulatory Visit: Payer: Self-pay | Admitting: Internal Medicine

## 2020-04-22 DIAGNOSIS — N63 Unspecified lump in unspecified breast: Secondary | ICD-10-CM

## 2020-04-27 ENCOUNTER — Ambulatory Visit
Admission: RE | Admit: 2020-04-27 | Discharge: 2020-04-27 | Disposition: A | Discharge status: Home / Self Care | Payer: Commercial Managed Care - PPO | Source: Ambulatory Visit | Attending: Internal Medicine | Admitting: Internal Medicine

## 2020-04-27 ENCOUNTER — Other Ambulatory Visit: Payer: Self-pay

## 2020-04-27 DIAGNOSIS — N63 Unspecified lump in unspecified breast: Secondary | ICD-10-CM | POA: Diagnosis not present

## 2020-05-27 ENCOUNTER — Ambulatory Visit: Payer: Commercial Managed Care - PPO | Admitting: Internal Medicine

## 2020-05-27 ENCOUNTER — Encounter: Payer: Self-pay | Admitting: Internal Medicine

## 2020-05-27 ENCOUNTER — Ambulatory Visit
Admission: RE | Admit: 2020-05-27 | Discharge: 2020-05-27 | Disposition: A | Discharge status: Home / Self Care | Payer: Commercial Managed Care - PPO | Source: Ambulatory Visit | Attending: Internal Medicine | Admitting: Internal Medicine

## 2020-05-27 ENCOUNTER — Other Ambulatory Visit: Payer: Self-pay

## 2020-05-27 ENCOUNTER — Ambulatory Visit
Admission: RE | Admit: 2020-05-27 | Discharge: 2020-05-27 | Disposition: A | Discharge status: Home / Self Care | Payer: Commercial Managed Care - PPO | Attending: Internal Medicine | Admitting: Internal Medicine

## 2020-05-27 VITALS — BP 117/71 | HR 73 | Temp 97.9°F | Resp 17 | Ht 63.5 in | Wt 162.4 lb

## 2020-05-27 DIAGNOSIS — R0602 Shortness of breath: Secondary | ICD-10-CM | POA: Insufficient documentation

## 2020-05-27 DIAGNOSIS — R053 Chronic cough: Secondary | ICD-10-CM

## 2020-05-27 DIAGNOSIS — Z8616 Personal history of COVID-19: Secondary | ICD-10-CM | POA: Diagnosis not present

## 2020-05-27 MED ORDER — ALBUTEROL SULFATE HFA 108 (90 BASE) MCG/ACT IN AERS: 2.0000 | INHALATION_SPRAY | Freq: Four times a day (QID) | RESPIRATORY_TRACT | 0 refills | Status: DC | PRN

## 2020-05-27 NOTE — Patient Instructions (Signed)

## 2020-05-27 NOTE — Progress Notes (Signed)
Subjective:    Patient ID: Jenny Castillo, female    DOB: 1975-09-22, 45 y.o.   MRN: [id number]  HPI  Pt presents to the clinic today with c/o cough and shortness of breath.  She reports the cough started about 4 months ago after having COVID.  The cough is dry and nonproductive.  She reports that shortness of breath started about 5 days ago.  She has noticed some associated wheezing but cannot associate the shortness of breath with any specific things such as talking, laying down.  She does feel like it is slightly worse with exertion.  She reports chest heaviness but denies chest pain, chest tightness or pain with inspiration.  She denies headache, runny nose, nasal congestion, ear pain, sore throat.  She denies reflux or heartburn.  She does not smoke.  She has tried an antihistamine OTC with minimal relief of symptoms.  She was recently started back on Plaquenil and Humira and is unsure if this could be related.    Review of Systems      Past Medical History:  Diagnosis Date  . Abnormal white blood cell (WBC)    low; white blood cell disorder  . Ankylosing spondylitis ([facility])   . Chronic fatigue   . Galactorrhea   . Immune neutropenia ([facility])   . Thyroid nodule     Current Outpatient Medications  Medication Sig Dispense Refill  . HUMIRA PEN 40 MG/0.4ML PNKT SMARTSIG:40 Milligram(s) SUB-Q Every 2 Weeks (Patient not taking: No sig reported)    . ibuprofen (ADVIL,MOTRIN) 200 MG tablet Take 600 mg by mouth every morning.    [name] levonorgestrel (MIRENA) 20 MCG/24HR IUD 1 each by Intrauterine route once. Inserted 01/2016    . levothyroxine (SYNTHROID) 25 MCG tablet TAKE 1 TABLET BY MOUTH DAILY BEFORE BREAKFAST. 90 tablet 2  . meloxicam (MOBIC) 15 MG tablet TAKE 1 TABLET BY MOUTH EVERY DAY (Patient taking differently: Take 15 mg by mouth as needed.) 30 tablet 2  . phentermine (ADIPEX-P) 37.5 MG tablet Take 37.5 mg by mouth daily. (Patient not taking: Reported on 02/24/2020)     No current  facility-administered medications for this visit.    Allergies  Allergen Reactions  . Bactrim [Sulfamethoxazole-Trimethoprim] Other (See Comments)    Has a low blood count has to stay away from certain medications   . Ceftin [Cefuroxime Axetil] Other (See Comments)  . Reglan [Metoclopramide] Other (See Comments)    Heavy pressure in chest , can't breathe  . Sulfa Antibiotics Other (See Comments)  . Rocephin [Ceftriaxone Sodium In Dextrose] Rash    Swelling     Family History  Problem Relation Age of Onset  . Cancer Mother 61       kidney-renal cell  . Stroke Father   . Hypertension Father   . Asthma Maternal Grandmother   . Hypertension Maternal Grandmother   . Stroke Paternal Grandmother   . Breast cancer Neg Hx     Social History   Socioeconomic History  . Marital status: Married    Spouse name: Not on file  . Number of children: 3  . Years of education: 44  . Highest education level: Not on file  Occupational History  . Occupation: insurance  Tobacco Use  . Smoking status: Former Smoker    Quit date: 01/18/1997    Years since quitting: 23.3  . Smokeless tobacco: Never Used  Vaping Use  . Vaping Use: Never used  Substance and Sexual Activity  . Alcohol use: Yes  Alcohol/week: 0.0 standard drinks  . Drug use: No  . Sexual activity: Yes  Other Topics Concern  . Not on file  Social History Narrative  . Not on file   Social Determinants of Health   Financial Resource Strain: Not on file  Food Insecurity: Not on file  Transportation Needs: Not on file  Physical Activity: Not on file  Stress: Not on file  Social Connections: Not on file  Intimate Partner Violence: Not on file     Constitutional: Denies fever, malaise, fatigue, headache or abrupt weight changes.  HEENT: Denies eye pain, eye redness, ear pain, ringing in the ears, wax buildup, runny nose, nasal congestion, bloody nose, or sore throat. Respiratory: Pt reports cough and shortness of  breath. Denies difficulty breathing, or sputum production.   Cardiovascular: Denies chest pain, chest tightness, palpitations or swelling in the hands or feet.  Gastrointestinal: Denies abdominal pain, bloating, constipation, diarrhea or blood in the stool.   No other specific complaints in a complete review of systems (except as listed in HPI above).  Objective:   Physical Exam  BP 117/71 (BP Location: Left Arm, Patient Position: Sitting, Cuff Size: Normal)   Pulse 73   Temp 97.9 F (36.6 C) (Temporal)   Resp 17   Ht 5' 3.5" (1.613 m)   Wt 162 lb 6.4 oz (73.7 kg)   SpO2 100%   BMI 28.32 kg/m    Wt Readings from Last 3 Encounters:  02/24/20 163 lb 12.8 oz (74.3 kg)  01/19/20 159 lb 12.8 oz (72.5 kg)  12/26/19 159 lb (72.1 kg)    General: Appears her stated age, well developed, well nourished in NAD. Skin: Dry and intact. HEENT: Head: normal shape and size; Eyes: sclera whiteand EOMs intact; Nose: No congestion noted.; Throat/Mouth: No hoarseness noted.   Neck: No adenopathy. Cardiovascular: Normal rate and rhythm. S1,S2 noted.  No murmur, rubs or gallops noted.  Pulmonary/Chest: Normal effort and positive vesicular breath sounds.  Dry cough noted.  No respiratory distress. No wheezes, rales or ronchi noted.  Neurological: Alert and oriented.   Psych: Mood and affect normal. Behavior is normal. Judgement and thought content normal.   BMET    Component Value Date/Time   NA 135 01/19/2020 1141   K 3.6 01/19/2020 1141   CL 101 01/19/2020 1141   CO2 24 01/19/2020 1141   GLUCOSE 89 01/19/2020 1141   BUN 6 01/19/2020 1141   CREATININE 0.61 01/19/2020 1141   CREATININE 0.87 12/14/2017 1502   CALCIUM 8.8 (L) 01/19/2020 1141   GFRNONAA >60 01/19/2020 1141    Lipid Panel     Component Value Date/Time   CHOL 130 12/08/2019 1519   TRIG 72.0 12/08/2019 1519   HDL 43.00 12/08/2019 1519   CHOLHDL 3 12/08/2019 1519   VLDL 14.4 12/08/2019 1519   LDLCALC 72 12/08/2019 1519    LDLCALC 92 12/14/2017 1502    CBC    Component Value Date/Time   WBC 3.0 (L) 01/19/2020 1141   RBC 4.37 01/19/2020 1141   HGB 13.5 01/19/2020 1141   HCT 39.5 01/19/2020 1141   PLT 163 01/19/2020 1141   MCV 90.4 01/19/2020 1141   MCH 30.9 01/19/2020 1141   MCHC 34.2 01/19/2020 1141   RDW 11.9 01/19/2020 1141   LYMPHSABS 0.8 01/19/2020 1141   MONOABS 0.5 01/19/2020 1141   EOSABS 0.1 01/19/2020 1141   BASOSABS 0.0 01/19/2020 1141    Hgb A1C Lab Results  Component Value Date   HGBA1C  5.7 12/20/2016          Assessment & Plan:  Cough, Shortness of Breath:  Walking sats did not reveal a decrease in her oxygen saturation on room air Antihistamine was not helpful We will have her trial omeprazole OTC daily x2 weeks Chest x-ray today for further evaluation of symptoms Rx for Albuterol 1 to 2 puffs every 4 to 6 hours as needed She declines Rx cough medication at this time If symptoms persist, would consider referral to pulmonology for further evaluation with pulmonary function test versus CT chest Plaquenil and Humira do have some adverse effects on respiratory status, she may want to bring this up with the pre scriber of these meds if symptoms persist  We will follow-up after x-ray, return precautions discussed Nicki Reaper, NP This visit occurred during the SARS-CoV-2 public health emergency.  Safety protocols were in place, including screening questions prior to the visit, additional usage of staff PPE, and extensive cleaning of exam room while observing appropriate contact time as indicated for disinfecting solutions.

## 2020-07-01 ENCOUNTER — Other Ambulatory Visit: Payer: Self-pay | Admitting: Internal Medicine

## 2020-08-23 ENCOUNTER — Encounter: Payer: Self-pay | Admitting: Oncology

## 2020-08-23 ENCOUNTER — Inpatient Hospital Stay: Payer: Commercial Managed Care - PPO | Attending: Oncology | Admitting: Oncology

## 2020-08-23 ENCOUNTER — Other Ambulatory Visit: Payer: Self-pay

## 2020-08-23 ENCOUNTER — Inpatient Hospital Stay: Payer: Commercial Managed Care - PPO

## 2020-08-23 ENCOUNTER — Ambulatory Visit
Admission: RE | Admit: 2020-08-23 | Discharge: 2020-08-23 | Disposition: A | Discharge status: Home / Self Care | Payer: Commercial Managed Care - PPO | Source: Ambulatory Visit | Attending: Oncology | Admitting: Oncology

## 2020-08-23 VITALS — BP 125/78 | HR 81 | Temp 97.5°F | Resp 18 | Wt 162.3 lb

## 2020-08-23 DIAGNOSIS — M47819 Spondylosis without myelopathy or radiculopathy, site unspecified: Secondary | ICD-10-CM

## 2020-08-23 DIAGNOSIS — M459 Ankylosing spondylitis of unspecified sites in spine: Secondary | ICD-10-CM | POA: Diagnosis not present

## 2020-08-23 DIAGNOSIS — Z79899 Other long term (current) drug therapy: Secondary | ICD-10-CM | POA: Insufficient documentation

## 2020-08-23 DIAGNOSIS — Z87891 Personal history of nicotine dependence: Secondary | ICD-10-CM | POA: Insufficient documentation

## 2020-08-23 DIAGNOSIS — R0602 Shortness of breath: Secondary | ICD-10-CM | POA: Insufficient documentation

## 2020-08-23 DIAGNOSIS — R591 Generalized enlarged lymph nodes: Secondary | ICD-10-CM

## 2020-08-23 DIAGNOSIS — D709 Neutropenia, unspecified: Secondary | ICD-10-CM | POA: Insufficient documentation

## 2020-08-23 LAB — BASIC METABOLIC PANEL
Anion gap: 10 (ref 5–15)
BUN: 11 mg/dL (ref 6–20)
CO2: 26 mmol/L (ref 22–32)
Calcium: 9.2 mg/dL (ref 8.9–10.3)
Chloride: 102 mmol/L (ref 98–111)
Creatinine, Ser: 0.74 mg/dL (ref 0.44–1.00)
GFR, Estimated: >60 mL/min (ref >60)
Glucose, Bld: 95 mg/dL (ref 70–99)
Potassium: 4.4 mmol/L (ref 3.5–5.1)
Sodium: 138 mmol/L (ref 135–145)

## 2020-08-23 MED ORDER — IOHEXOL 350 MG/ML SOLN
75.0000 mL | Freq: Once | INTRAVENOUS | Status: AC | PRN
  Administered 2020-08-23: 75 mL via INTRAVENOUS

## 2020-08-23 NOTE — Progress Notes (Signed)
Hematology/Oncology progress note [facility] Telephone:([phone] [phone]   Patient Care Team: Jearld Fenton, NP as PCP - General (Internal Medicine)  REFERRING PROVIDER: Jearld Fenton, NP  CHIEF COMPLAINTS/REASON FOR VISIT:  Follow up for right axillary lymphadenopathy  HISTORY OF PRESENTING ILLNESS:   Jenny Castillo is a  45 y.o.  female with PMH listed below was seen in consultation at the request of  Jearld Fenton, NP  for evaluation of right axillary lymphadenopathy  11/30/2017, bilateral screening mammogram recommend ultrasound of the right axilla for further evaluation of right axillary mass.  Patient underwent ultrasound evaluation which showed 3.7 cm right axillary lymph node and ultrasound-guided biopsy of the prominent right axillary lymph node.  Pathology showed fragments of benign reactive appearing lymph node with reactive follicular lymphoid hyperplasia.  No evidence of malignancy.  01/06/2020, patient had a repeat diagnostic mammogram bilaterally as well as ultrasound which again showed right axillary lymphadenopathy, stable cortical thickness 1.1 cm comparing to 1 cm in November 2019.  A couple other prominent adjacent lymph nodes.  Patient was recommended to establish care with hematology oncology for further evaluation   Patient reports history of right axillary mass for which she underwent right axillary lymph node dissection in 2001 in city of [place].  Patient results were benign.  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  Patient has a diagnosis of seronegative spondyloarthropathy for which she was previously following up with [facility] rheumatology Dr. Denyse Amass.  Patient was last seen on 03/24/2019.  Patient has had Humira treatment which she feels helpful for her elbow symptoms.  She has lost follow-up since then as her rheumatologist have left the practice.  She was also previously seen by [facility]  clinic rheumatology Dr. Meda Coffee who has also left the practice.  Patient denies any family history of breast cancer.Denies weight loss, fever, chills, fatigue, night sweats.  Occasionally she has experienced some hot flash which she attributes to perimenopausal symptoms. She has a history of IUD placement. Patient also has had bone marrow biopsy done in 2002 with the results scanned into epic's on 12/31/2015 50% cellularity, megakaryocytes are present in normal numbers and are morphologically unremarkable.  No aggregates or sheets of immature cells are identified and the lymphocytes do not appear to be increased in numbers.  Hematopoietic marrow consist of maturing myeloid and erythroid precursors. There was presence of CD19/CD10 coexpressing cells which were considered to be immature bones rather than blast or acute lymphoblastic leukemia.  INTERVAL HISTORY Jenny Castillo is a 45 y.o. female who has above history reviewed by me today presents for follow up visit for management of recurrent axillary lymphadenopathy She reports having progressively worsen SOB with exertion and cough. She was seen by her PCP in May 2022 and had a normal CXR. No improvement of her symptoms and she continues to have dry cough and SOB.  No calf tenderness or erythema.  Since last visit she has established care with Dr.Patel. restarted on Humira and plaquenil.  She was recently seen by him again for follow up. Had cbc done at [facility] - lab result is not available to me. She showed me her result using her cell phone.  Chronic neutropenia, unchanged, mildly decreased ANC. Normal hemoglobin and platelet counts.   Review of Systems  Constitutional:  Negative for appetite change, chills, fatigue and fever.  HENT:   Negative for hearing loss and voice change.   Eyes:  Negative for eye  problems.  Respiratory:  Positive for cough and shortness of breath. Negative for chest tightness.   Cardiovascular:  Negative for chest pain.   Gastrointestinal:  Negative for abdominal distention, abdominal pain and blood in stool.  Endocrine: Negative for hot flashes.  Genitourinary:  Negative for difficulty urinating and frequency.   Musculoskeletal:  Negative for arthralgias.  Skin:  Negative for itching and rash.  Neurological:  Negative for extremity weakness.  Hematological:  Positive for adenopathy.  Psychiatric/Behavioral:  Negative for confusion.    MEDICAL HISTORY:  Past Medical History:  Diagnosis Date   Abnormal white blood cell (WBC)    low; white blood cell disorder   Ankylosing spondylitis (HCC)    Chronic fatigue    Galactorrhea    Immune neutropenia (HCC)    Thyroid nodule     SURGICAL HISTORY: Past Surgical History:  Procedure Laterality Date   AXILLARY LYMPH NODE BIOPSY Right 2001   benign mass   BONE MARROW BIOPSY     BREAST BIOPSY Right 12/07/2017   BENIGN, REACTIVE APPEARING LYMPH NODE WITH REACTIVE    CESAREAN SECTION  2004; 2010   WISDOM TOOTH EXTRACTION      SOCIAL HISTORY: Social History   Socioeconomic History   Marital status: Married    Spouse name: Not on file   Number of children: 3   Years of education: 14   Highest education level: Not on file  Occupational History   Occupation: insurance  Tobacco Use   Smoking status: Former    Types: Cigarettes    Quit date: 01/18/1997    Years since quitting: 23.6   Smokeless tobacco: Never  Vaping Use   Vaping Use: Never used  Substance and Sexual Activity   Alcohol use: Yes    Alcohol/week: 0.0 standard drinks   Drug use: No   Sexual activity: Yes  Other Topics Concern   Not on file  Social History Narrative   Not on file   Social Determinants of Health   Financial Resource Strain: Not on file  Food Insecurity: Not on file  Transportation Needs: Not on file  Physical Activity: Not on file  Stress: Not on file  Social Connections: Not on file  Intimate Partner Violence: Not on file    FAMILY HISTORY: Family  History  Problem Relation Age of Onset   Cancer Mother 79       kidney-renal cell   Stroke Father    Hypertension Father    Asthma Maternal Grandmother    Hypertension Maternal Grandmother    Stroke Paternal Grandmother    Breast cancer Neg Hx     ALLERGIES:  is allergic to bactrim [sulfamethoxazole-trimethoprim], ceftin [cefuroxime axetil], reglan [metoclopramide], sulfa antibiotics, and rocephin [ceftriaxone sodium in dextrose].  MEDICATIONS:  Current Outpatient Medications  Medication Sig Dispense Refill   albuterol (VENTOLIN HFA) 108 (90 Base) MCG/ACT inhaler TAKE 2 PUFFS BY MOUTH EVERY 6 HOURS AS NEEDED FOR WHEEZE OR SHORTNESS OF BREATH 8.5 each 0   HUMIRA PEN 40 MG/0.4ML PNKT 40 mg every 14 (fourteen) days.     hydroxychloroquine (PLAQUENIL) 200 MG tablet Take 1 tablet by mouth 2 (two) times daily.     ibuprofen (ADVIL,MOTRIN) 200 MG tablet Take 600 mg by mouth every morning.     levonorgestrel (MIRENA) 20 MCG/24HR IUD 1 each by Intrauterine route once. Inserted 01/2016     levothyroxine (SYNTHROID) 25 MCG tablet TAKE 1 TABLET BY MOUTH DAILY BEFORE BREAKFAST. 90 tablet 2   meloxicam (MOBIC) 15  MG tablet TAKE 1 TABLET BY MOUTH EVERY DAY (Patient not taking: No sig reported) 30 tablet 2   phentermine (ADIPEX-P) 37.5 MG tablet Take 37.5 mg by mouth daily. (Patient not taking: Reported on 08/23/2020)     No current facility-administered medications for this visit.     PHYSICAL EXAMINATION: ECOG PERFORMANCE STATUS: 0 - Asymptomatic Vitals:   08/23/20 1002  BP: 125/78  Pulse: 81  Resp: 18  Temp: (!) 97.5 F (36.4 C)  SpO2: 100%   Filed Weights   08/23/20 1002  Weight: 162 lb 4.8 oz (73.6 kg)    Physical Exam Constitutional:      General: She is not in acute distress. HENT:     Head: Normocephalic and atraumatic.  Eyes:     General: No scleral icterus. Cardiovascular:     Rate and Rhythm: Normal rate and regular rhythm.     Heart sounds: Normal heart sounds.   Pulmonary:     Effort: Pulmonary effort is normal. No respiratory distress.     Breath sounds: No wheezing.  Abdominal:     General: Bowel sounds are normal. There is no distension.     Palpations: Abdomen is soft.  Musculoskeletal:        General: No deformity. Normal range of motion.     Cervical back: Normal range of motion and neck supple.  Skin:    General: Skin is warm and dry.     Findings: No erythema or rash.  Neurological:     Mental Status: She is alert and oriented to person, place, and time. Mental status is at baseline.     Cranial Nerves: No cranial nerve deficit.     Coordination: Coordination normal.  Psychiatric:        Mood and Affect: Mood normal.  Palpable right axillary lymphadenopathy  LABORATORY DATA:  I have reviewed the data as listed Lab Results  Component Value Date   WBC 3.0 (L) 01/19/2020   HGB 13.5 01/19/2020   HCT 39.5 01/19/2020   MCV 90.4 01/19/2020   PLT 163 01/19/2020   Recent Labs    12/08/19 1519 01/19/20 1141 08/23/20 1112  NA 136 135 138  K 4.0 3.6 4.4  CL 100 101 102  CO2 _0 GLUCOSE 90 89 95  BUN _1 CREATININE 0.76 0.61 0.74  CALCIUM 9.1 8.8* 9.2  GFRNONAA  --  >60 >60  PROT 7.5 8.0  --   ALBUMIN 4.5 4.3  --   AST 20 25  --   ALT 14 22  --   ALKPHOS 46 42  --   BILITOT 0.3 0.5  --     Iron/TIBC/Ferritin/ %Sat    Component Value Date/Time   IRON 98 12/08/2019 1519   IRONPCTSAT 32.1 12/08/2019 1519      RADIOGRAPHIC STUDIES: I have personally reviewed the radiological images as listed and agreed with the findings in the report. CT Angio Chest Pulmonary Embolism (PE) W or WO Contrast  Result Date: 08/23/2020 CLINICAL DATA:  Shortness of breath and cough. COVID earlier this year. EXAM: CT ANGIOGRAPHY CHEST WITH CONTRAST TECHNIQUE: Multidetector CT imaging of the chest was performed using the standard protocol during bolus administration of intravenous contrast. Multiplanar CT image reconstructions and  MIPs were obtained to evaluate the vascular anatomy. CONTRAST:  42m OMNIPAQUE IOHEXOL 350 MG/ML SOLN COMPARISON:  Two-view chest x-ray 05/27/2020 FINDINGS: Cardiovascular: Heart size is normal. Aortic great vessels are within normal limits. Pulmonary artery opacification  is excellent. No focal filling defects are present to suggest pulmonary embolus. Mediastinum/Nodes: No significant mediastinal or hilar adenopathy is present. Enlarged axillary nodes are present on the right. The largest node measures 2.4 x 1.4 x 2.1 cm. Lungs/Pleura: Minimal dependent atelectasis is present. Lungs are otherwise clear. No focal airspace consolidation is present. No nodule or mass lesion present. No significant pleural disease is present. Upper Abdomen: Visualized upper abdomen is unremarkable. Musculoskeletal: Vertebral body heights and alignment are normal. No focal osseous lesions are present. Ribs are unremarkable. Review of the MIP images confirms the above findings. IMPRESSION: 1. No pulmonary embolus. 2. No acute or focal lesion to explain the patient's shortness of breath. 3. Enlarged right axillary nodes are nonspecific. This may be related to the patient's surgery. Clinical correlation and correlation with mammography recommended. Electronically Signed   By: San Morelle M.D.   On: 08/23/2020 12:37      ASSESSMENT & PLAN:  1. Lymphadenopathy   2. Neutropenia, unspecified type ([place])   3. Spondyloarthropathy   4. Shortness of breath    #Recurrent right axillary lymphadenopathy Previous images were independent reviewed and discussed with patient. She has had right axillary lymphadenopathy worked up twice in the past, remotely with axillary lymph node dissection, more recently with prominent lymphadenopathy biopsy, both did not establish any malignancy.  Lack of family history of breast cancer, negative findings on diagnostic mammogram. It is most possible that lymphadenopathy is secondary to her underlying  autoimmune condition.  Uncommon differential includes Castleman's disease [no splenomegaly, no constitutional symptoms],  Kikuchi's disease[no constitutional symptoms], Lymphoma [no constitutional symptoms], inflammatory pseudotumor Vasculitis etc. Repeat US axillary showed persistently enlarged axillary LN.  She does not have constitutional symptoms. Recommend her to keep annual mammogram through PCP's office.   Progressively worsen SOB and cough ? Cough variant asthma vs COVID 19 conseqeunce, vs PE vs other etiologies.   She is on [place] IUD Check STAT chest angiogram for further evaluation. She agrees with the plan.  CT showed no PE, enlarged right axillary LN. No acute process to explain her symptoms. Follow up with PCP.  The size of the lymph node is 2.4cm, which is larger than size on her recent ultrasound, and smaller than the size of 3.4cm on US done in 2019. It appears that the lymph node size fluctuates. CT results were communicated with her over the phone and I recommend her to get her annual diagnostic mammogram for further monitoring.   History of seronegative spondyloarthropathy, on Humira and plaquenil.   Orders Placed This Encounter  Procedures   CT Angio Chest Pulmonary Embolism (PE) W or WO Contrast    Standing Status:   Future    Number of Occurrences:   1    Standing Expiration Date:   08/23/2021    Order Specific Question:   If indicated for the ordered procedure, I authorize the administration of contrast media per Radiology protocol    Answer:   Yes    Order Specific Question:   Is patient pregnant?    Answer:   No    Order Specific Question:   Preferred imaging location?    Answer:   [facility]    Order Specific Question:   Call Results- Best Contact Number?    Answer:   [phone] do not hold   Basic metabolic panel    Standing Status:   Future    Number of Occurrences:   1    Standing Expiration Date:   08/23/2021  All questions were answered. The  patient knows to call the clinic with any problems questions or concerns.  cc Jearld Fenton, NP    Return of visit: follow up PRN   Earlie Server, MD, PhD Hematology Oncology [facility] Pager- [phone] 08/23/2020

## 2020-08-23 NOTE — Progress Notes (Signed)
SOB after lots of activity; saw PCP back in April for asthma sx. CHEST x-ray looked fine. notices when she uses inhaler it helps with her cough. feels tight and more significant now.

## 2020-09-16 ENCOUNTER — Other Ambulatory Visit: Payer: Self-pay | Admitting: Internal Medicine

## 2020-09-16 NOTE — Telephone Encounter (Signed)
Requested Prescriptions  Pending Prescriptions Disp Refills  . albuterol (VENTOLIN HFA) 108 (90 Base) MCG/ACT inhaler [Pharmacy Med Name: ALBUTEROL HFA (PROAIR) INHALER] 8.5 each 0    Sig: TAKE 2 PUFFS BY MOUTH EVERY 6 HOURS AS NEEDED FOR WHEEZE OR SHORTNESS OF BREATH     Pulmonology:  Beta Agonists Failed - 09/16/2020  1:42 AM      Failed - One inhaler should last at least one month. If the patient is requesting refills earlier, contact the patient to check for uncontrolled symptoms.      Passed - Valid encounter within last 12 months    Recent Outpatient Visits          3 months ago Chronic cough   [facility] [place], [place], [place]

## 2020-10-13 ENCOUNTER — Other Ambulatory Visit: Payer: Self-pay | Admitting: Internal Medicine

## 2020-10-14 ENCOUNTER — Other Ambulatory Visit: Payer: Self-pay | Admitting: Internal Medicine

## 2020-10-14 NOTE — Telephone Encounter (Signed)
Patient will need an office visit for further refills  

## 2020-11-09 ENCOUNTER — Other Ambulatory Visit: Payer: Self-pay | Admitting: Internal Medicine

## 2020-11-09 NOTE — Telephone Encounter (Signed)
Requested Prescriptions  Pending Prescriptions Disp Refills  . albuterol (VENTOLIN HFA) 108 (90 Base) MCG/ACT inhaler [Pharmacy Med Name: ALBUTEROL HFA (PROAIR) INHALER] 8.5 each 2    Sig: TAKE 2 PUFFS BY MOUTH EVERY 6 HOURS AS NEEDED FOR WHEEZE OR SHORTNESS OF BREATH     Pulmonology:  Beta Agonists Failed - 11/09/2020  1:44 AM      Failed - One inhaler should last at least one month. If the patient is requesting refills earlier, contact the patient to check for uncontrolled symptoms.      Passed - Valid encounter within last 12 months    Recent Outpatient Visits          5 months ago Chronic cough   [facility] [place], [place], [place]

## 2021-01-11 ENCOUNTER — Other Ambulatory Visit: Payer: Self-pay | Admitting: Internal Medicine

## 2021-01-11 NOTE — Telephone Encounter (Signed)
Requested medications are due for refill today.  yes  Requested medications are on the active medications list.  yes  Last refill. 9/22/202  Future visit scheduled.   Yes - in 2 days.  Notes to clinic.  Labs are expired.    Requested Prescriptions  Pending Prescriptions Disp Refills   levothyroxine (SYNTHROID) 25 MCG tablet [Pharmacy Med Name: LEVOTHYROXINE 25 MCG TABLET] 90 tablet 0    Sig: TAKE 1 TABLET BY MOUTH EVERY DAY BEFORE BREAKFAST     Endocrinology:  Hypothyroid Agents Failed - 01/11/2021  9:58 AM      Failed - TSH needs to be rechecked within 3 months after an abnormal result. Refill until TSH is due.      Failed - TSH in normal range and within 360 days    TSH  Date Value Ref Range Status  12/08/2019 1.77 0.35 - 4.50 uIU/mL Final          Passed - Valid encounter within last 12 months    Recent Outpatient Visits           7 months ago Chronic cough   [facility] [place], [place], NP       Future Appointments             In 2 days [place], [place], NP [facility], [facility]

## 2021-01-13 ENCOUNTER — Encounter: Payer: Self-pay | Admitting: Internal Medicine

## 2021-01-13 ENCOUNTER — Ambulatory Visit (INDEPENDENT_AMBULATORY_CARE_PROVIDER_SITE_OTHER): Payer: Commercial Managed Care - PPO | Admitting: Internal Medicine

## 2021-01-13 ENCOUNTER — Other Ambulatory Visit: Payer: Self-pay

## 2021-01-13 VITALS — BP 101/72 | HR 65 | Temp 97.3°F | Resp 17 | Ht 63.5 in | Wt 165.4 lb

## 2021-01-13 DIAGNOSIS — M459 Ankylosing spondylitis of unspecified sites in spine: Secondary | ICD-10-CM

## 2021-01-13 DIAGNOSIS — E663 Overweight: Secondary | ICD-10-CM

## 2021-01-13 DIAGNOSIS — D709 Neutropenia, unspecified: Secondary | ICD-10-CM | POA: Diagnosis not present

## 2021-01-13 DIAGNOSIS — Z1211 Encounter for screening for malignant neoplasm of colon: Secondary | ICD-10-CM | POA: Diagnosis not present

## 2021-01-13 DIAGNOSIS — R7303 Prediabetes: Secondary | ICD-10-CM | POA: Diagnosis not present

## 2021-01-13 DIAGNOSIS — Z6828 Body mass index (BMI) 28.0-28.9, adult: Secondary | ICD-10-CM

## 2021-01-13 DIAGNOSIS — E6609 Other obesity due to excess calories: Secondary | ICD-10-CM | POA: Insufficient documentation

## 2021-01-13 DIAGNOSIS — E039 Hypothyroidism, unspecified: Secondary | ICD-10-CM

## 2021-01-13 DIAGNOSIS — Z0001 Encounter for general adult medical examination with abnormal findings: Secondary | ICD-10-CM

## 2021-01-13 NOTE — Progress Notes (Signed)
Subjective:    Patient ID: Jenny Castillo, female    DOB: [birth year], 45 y.o.   MRN: [id number]  HPI  Patient presents the clinic today for her annual exam.  She is also due to follow-up chronic conditions.  Hypothyroidism: She denies any issues on her current dose of Levothyroxine.  She does not follow with endocrinology.  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  She is following with hematology.  Ankylosing Spondylitis: Managed with Meloxicam and Humira.  She follows with rheumatology.  Prediabetes: Her last A1C was 5.7%, 11/2016. She is not taking any oral diabetic medication at this time. She does not check her sugars.  Flu: 09/2020 Tetanus: Unsure COVID: Never Pap smear: 11/2015 Mammogram: 12/2019 Colon screening: never Vision screening: annually Dentist: biannually  Diet: She does eat meat. She consumes fruits and veggies. She occasionally eats fried foods. She drinks mostly coffee, tea and water Exercise: Walking  Review of Systems     Past Medical History:  Diagnosis Date   Abnormal white blood cell (WBC)    low; white blood cell disorder   Ankylosing spondylitis (HCC)    Chronic fatigue    Galactorrhea    Immune neutropenia (HCC)    Thyroid nodule     Current Outpatient Medications  Medication Sig Dispense Refill   albuterol (VENTOLIN HFA) 108 (90 Base) MCG/ACT inhaler TAKE 2 PUFFS BY MOUTH EVERY 6 HOURS AS NEEDED FOR WHEEZE OR SHORTNESS OF BREATH 8.5 each 2   HUMIRA PEN 40 MG/0.4ML PNKT 40 mg every 14 (fourteen) days.     ibuprofen (ADVIL,MOTRIN) 200 MG tablet Take 600 mg by mouth every morning.     levonorgestrel (MIRENA) 20 MCG/24HR IUD 1 each by Intrauterine route once. Inserted 01/2016     levothyroxine (SYNTHROID) 25 MCG tablet TAKE 1 TABLET BY MOUTH EVERY DAY BEFORE BREAKFAST 90 tablet 0   meloxicam (MOBIC) 15 MG tablet TAKE 1 TABLET BY MOUTH EVERY DAY 30 tablet 2   hydroxychloroquine (PLAQUENIL) 200 MG tablet Take 1 tablet by mouth 2  (two) times daily. (Patient not taking: Reported on 01/13/2021)     phentermine (ADIPEX-P) 37.5 MG tablet Take 37.5 mg by mouth daily. (Patient not taking: Reported on 08/23/2020)     No current facility-administered medications for this visit.    Allergies  Allergen Reactions   Bactrim [Sulfamethoxazole-Trimethoprim] Other (See Comments)    Has a low blood count has to stay away from certain medications    Ceftin [Cefuroxime Axetil] Other (See Comments)   Reglan [Metoclopramide] Other (See Comments)    Heavy pressure in chest , can't breathe   Sulfa Antibiotics Other (See Comments)   Rocephin [Ceftriaxone Sodium In Dextrose] Rash    Swelling     Family History  Problem Relation Age of Onset   Cancer Mother 71       kidney-renal cell   Stroke Father    Hypertension Father    Asthma Maternal Grandmother    Hypertension Maternal Grandmother    Stroke Paternal Grandmother    Breast cancer Neg Hx     Social History   Socioeconomic History   Marital status: Married    Spouse name: Not on file   Number of children: 3   Years of education: 14   Highest education level: Not on file  Occupational History   Occupation: insurance  Tobacco Use   Smoking status: Former    Types: Cigarettes    Quit date: 01/18/1997    Years  since quitting: 24.0   Smokeless tobacco: Never  Vaping Use   Vaping Use: Never used  Substance and Sexual Activity   Alcohol use: Yes    Alcohol/week: 0.0 standard drinks   Drug use: No   Sexual activity: Yes  Other Topics Concern   Not on file  Social History Narrative   Not on file   Social Determinants of Health   Financial Resource Strain: Not on file  Food Insecurity: Not on file  Transportation Needs: Not on file  Physical Activity: Not on file  Stress: Not on file  Social Connections: Not on file  Intimate Partner Violence: Not on file     Constitutional: Denies fever, malaise, fatigue, headache or abrupt weight changes.  HEENT:  Denies eye pain, eye redness, ear pain, ringing in the ears, wax buildup, runny nose, nasal congestion, bloody nose, or sore throat. Respiratory: Denies difficulty breathing, shortness of breath, cough or sputum production.   Cardiovascular: Denies chest pain, chest tightness, palpitations or swelling in the hands or feet.  Gastrointestinal: Denies abdominal pain, bloating, constipation, diarrhea or blood in the stool.  GU: Denies urgency, frequency, pain with urination, burning sensation, blood in urine, odor or discharge. Musculoskeletal: Pt reports joint pain. Denies decrease in range of motion, difficulty with gait, muscle pain or joint swelling.  Skin: Denies redness, rashes, lesions or ulcercations.  Neurological: Denies dizziness, difficulty with memory, difficulty with speech or problems with balance and coordination.  Psych: Denies anxiety, depression, SI/HI.  No other specific complaints in a complete review of systems (except as listed in HPI above).  Objective:   Physical Exam   BP 101/72 (BP Location: Right Arm, Patient Position: Sitting, Cuff Size: Normal)    Pulse 65    Temp (!) 97.3 F (36.3 C) (Temporal)    Resp 17    Ht 5' 3.5" (1.613 m)    Wt 165 lb 6.4 oz (75 kg)    SpO2 100%    BMI 28.84 kg/m   Wt Readings from Last 3 Encounters:  08/23/20 162 lb 4.8 oz (73.6 kg)  05/27/20 162 lb 6.4 oz (73.7 kg)  02/24/20 163 lb 12.8 oz (74.3 kg)    General: Appears her stated age, overweight, in NAD. Skin: Warm, dry and intact.  HEENT: Head: normal shape and size; Eyes: sclera white and EOMs intact;  Neck:  Neck supple, trachea midline. No masses, lumps or thyromegaly present.  Cardiovascular: Normal rate and rhythm. S1,S2 noted.  No murmur, rubs or gallops noted. No JVD or BLE edema.  Pulmonary/Chest: Normal effort and positive vesicular breath sounds. No respiratory distress. No wheezes, rales or ronchi noted.  Abdomen: Soft and nontender. Normal bowel sounds. No distention or  masses noted. Liver, spleen and kidneys non palpable. Musculoskeletal: Strength 5/5 BUE/BLE. No difficulty with gait.  Neurological: Alert and oriented. Cranial nerves II-XII grossly intact. Coordination normal.  Psychiatric: Mood and affect normal. Behavior is normal. Judgment and thought content normal.    BMET    Component Value Date/Time   NA 138 08/23/2020 1112   K 4.4 08/23/2020 1112   CL 102 08/23/2020 1112   CO2 26 08/23/2020 1112   GLUCOSE 95 08/23/2020 1112   BUN 11 08/23/2020 1112   CREATININE 0.74 08/23/2020 1112   CREATININE 0.87 12/14/2017 1502   CALCIUM 9.2 08/23/2020 1112   GFRNONAA >60 08/23/2020 1112    Lipid Panel     Component Value Date/Time   CHOL 130 12/08/2019 1519   TRIG 72.0  12/08/2019 1519   HDL 43.00 12/08/2019 1519   CHOLHDL 3 12/08/2019 1519   VLDL 14.4 12/08/2019 1519   LDLCALC 72 12/08/2019 1519   LDLCALC 92 12/14/2017 1502    CBC    Component Value Date/Time   WBC 3.0 (L) 01/19/2020 1141   RBC 4.37 01/19/2020 1141   HGB 13.5 01/19/2020 1141   HCT 39.5 01/19/2020 1141   PLT 163 01/19/2020 1141   MCV 90.4 01/19/2020 1141   MCH 30.9 01/19/2020 1141   MCHC 34.2 01/19/2020 1141   RDW 11.9 01/19/2020 1141   LYMPHSABS 0.8 01/19/2020 1141   MONOABS 0.5 01/19/2020 1141   EOSABS 0.1 01/19/2020 1141   BASOSABS 0.0 01/19/2020 1141    Hgb A1C Lab Results  Component Value Date   HGBA1C 5.7 12/20/2016           Assessment & Plan:   Preventative Health Maintenance:  Flu shot UTD She declines tetanus vaccine She decline covid vaccine Pap smear due- she will call [facility] to schdeule Mammogram due- she will call to schedule Referral to GI for screening colonoscopy Encouraged her to consume a balanced diet and exercise regimen Advised her to see an eye doctor and dentist annually Will check CBC, CMET, TSH, Free T4, Lipid and A1C today  RTC in 1 year, sooner if needed Nicki Reaper, NP This visit occurred during the SARS-CoV-2  public health emergency.  Safety protocols were in place, including screening questions prior to the visit, additional usage of staff PPE, and extensive cleaning of exam room while observing appropriate contact time as indicated for disinfecting solutions.

## 2021-01-13 NOTE — Assessment & Plan Note (Signed)
CBC with differential today She will continue to follow with hematology

## 2021-01-13 NOTE — Assessment & Plan Note (Signed)
TSH and free T4 today Will adjust Levothyroxine if needed based on labs 

## 2021-01-13 NOTE — Assessment & Plan Note (Signed)
Encourage diet and exercise for weight loss 

## 2021-01-13 NOTE — Assessment & Plan Note (Signed)
A1c today.  

## 2021-01-13 NOTE — Assessment & Plan Note (Signed)
Managed with Meloxicam and Humira She will continue to follow with rheumatology

## 2021-01-13 NOTE — Patient Instructions (Signed)

## 2021-01-14 LAB — CBC WITH DIFFERENTIAL/PLATELET
Absolute Monocytes: 594 cells/uL (ref 200–950)
Basophils Absolute: 30 cells/uL (ref 0–200)
Basophils Relative: 0.9 %
Eosinophils Absolute: 79 cells/uL (ref 15–500)
Eosinophils Relative: 2.4 %
HCT: 39.2 % (ref 35.0–45.0)
Hemoglobin: 13.2 g/dL (ref 11.7–15.5)
Lymphs Abs: 1188 cells/uL (ref 850–3900)
MCH: 31.2 pg (ref 27.0–33.0)
MCHC: 33.7 g/dL (ref 32.0–36.0)
MCV: 92.7 fL (ref 80.0–100.0)
MPV: 10.9 fL (ref 7.5–12.5)
Monocytes Relative: 18 %
Neutro Abs: 1409 cells/uL — ABNORMAL LOW (ref 1500–7800)
Neutrophils Relative %: 42.7 %
Platelets: 191 10*3/uL (ref 140–400)
RBC: 4.23 10*6/uL (ref 3.80–5.10)
RDW: 12.5 % (ref 11.0–15.0)
Total Lymphocyte: 36 %
WBC: 3.3 10*3/uL — ABNORMAL LOW (ref 3.8–10.8)

## 2021-01-14 LAB — LIPID PANEL
Cholesterol: 157 mg/dL (ref <200)
HDL: 51 mg/dL (ref >=50)
LDL Cholesterol (Calc): 86 mg/dL (calc)
Non-HDL Cholesterol (Calc): 106 mg/dL (calc) (ref <130)
Total CHOL/HDL Ratio: 3.1 (calc) (ref <5.0)
Triglycerides: 102 mg/dL (ref <150)

## 2021-01-14 LAB — HEMOGLOBIN A1C
Hgb A1c MFr Bld: 5.5 % of total Hgb (ref <5.7)
Mean Plasma Glucose: 111 mg/dL
eAG (mmol/L): 6.2 mmol/L

## 2021-01-14 LAB — COMPLETE METABOLIC PANEL WITH GFR
AG Ratio: 1.4 (calc) (ref 1.0–2.5)
ALT: 20 U/L (ref 6–29)
AST: 19 U/L (ref 10–35)
Albumin: 4.2 g/dL (ref 3.6–5.1)
Alkaline phosphatase (APISO): 46 U/L (ref 31–125)
BUN: 9 mg/dL (ref 7–25)
CO2: 28 mmol/L (ref 20–32)
Calcium: 8.9 mg/dL (ref 8.6–10.2)
Chloride: 104 mmol/L (ref 98–110)
Creat: 0.74 mg/dL (ref 0.50–0.99)
Globulin: 2.9 g/dL (calc) (ref 1.9–3.7)
Glucose, Bld: 90 mg/dL (ref 65–99)
Potassium: 4 mmol/L (ref 3.5–5.3)
Sodium: 139 mmol/L (ref 135–146)
Total Bilirubin: 0.4 mg/dL (ref 0.2–1.2)
Total Protein: 7.1 g/dL (ref 6.1–8.1)
eGFR: 100 mL/min/{1.73_m2} (ref >=60)

## 2021-01-14 LAB — TSH: TSH: 2.43 mIU/L

## 2021-01-14 LAB — T4, FREE: Free T4: 1 ng/dL (ref 0.8–1.8)

## 2021-01-26 ENCOUNTER — Telehealth: Payer: Self-pay

## 2021-01-26 NOTE — Telephone Encounter (Signed)
CALLED PATIENT NO ANSWER LEFT VOICEMAIL FOR A CALL BACK ? ?

## 2021-01-26 NOTE — Telephone Encounter (Signed)
CALLED PATIENT NO ANSWER LEFT VOICEMAIL FOR A CALL BACK °Letter sent °

## 2021-02-01 ENCOUNTER — Ambulatory Visit
Admission: EM | Admit: 2021-02-01 | Discharge: 2021-02-01 | Disposition: A | Discharge status: Home / Self Care | Payer: Commercial Managed Care - PPO | Attending: Emergency Medicine | Admitting: Emergency Medicine

## 2021-02-01 ENCOUNTER — Other Ambulatory Visit: Payer: Self-pay

## 2021-02-01 ENCOUNTER — Ambulatory Visit (INDEPENDENT_AMBULATORY_CARE_PROVIDER_SITE_OTHER): Payer: Commercial Managed Care - PPO

## 2021-02-01 ENCOUNTER — Encounter: Payer: Self-pay | Admitting: Emergency Medicine

## 2021-02-01 ENCOUNTER — Other Ambulatory Visit: Payer: Self-pay | Admitting: Internal Medicine

## 2021-02-01 DIAGNOSIS — J209 Acute bronchitis, unspecified: Secondary | ICD-10-CM

## 2021-02-01 DIAGNOSIS — J0100 Acute maxillary sinusitis, unspecified: Secondary | ICD-10-CM

## 2021-02-01 DIAGNOSIS — Z1231 Encounter for screening mammogram for malignant neoplasm of breast: Secondary | ICD-10-CM

## 2021-02-01 DIAGNOSIS — R059 Cough, unspecified: Secondary | ICD-10-CM

## 2021-02-01 MED ORDER — AZITHROMYCIN 250 MG PO TABS: 250.0000 mg | ORAL_TABLET | Freq: Every day | ORAL | 0 refills | Status: DC

## 2021-02-01 MED ORDER — PREDNISONE 10 MG (21) PO TBPK
ORAL_TABLET | Freq: Every day | ORAL | 0 refills | Status: DC
Start: 2021-02-01 — End: 2021-03-07

## 2021-02-01 NOTE — ED Triage Notes (Signed)
Pt here post URI with lingering deep cough that is not productive, but is congested. SOB at night. Post nasal drip is also present. Pt takes an albuterol inhaler and had it about 30 minutes prior to this visit.

## 2021-02-01 NOTE — Discharge Instructions (Addendum)
Take the Zithromax and prednisone as directed.  Continue to use the albuterol inhaler.  Follow up with your primary care provider if your symptoms are not improving.

## 2021-02-01 NOTE — ED Provider Notes (Signed)
Roderic Palau    CSN: [id number] Arrival date & time: 02/01/21  1497      History   Chief Complaint Chief Complaint  Patient presents with   Cough   Nasal Congestion   Chest Congestion    HPI Shavonta Gossen is a 46 y.o. female.  Patient presents with cough and shortness of breath x7 days.  She also reports low grade fever, mild ear pain, and postnasal drip.  Treating at home with albuterol inhaler.  She denies rash, vomiting, diarrhea, or other symptoms.  No history of respiratory illness.  The history is provided by the patient and medical records.   Past Medical History:  Diagnosis Date   Abnormal white blood cell (WBC)    low; white blood cell disorder   Ankylosing spondylitis (HCC)    Chronic fatigue    Galactorrhea    Immune neutropenia (HCC)    Thyroid nodule     Patient Active Problem List   Diagnosis Date Noted   Overweight with body mass index (BMI) of 28 to 28.9 in adult 01/13/2021   Prediabetes 01/13/2021   Acquired hypothyroidism 01/04/2018   Ankylosing spondylitis ([place]) 12/12/2016   Immune neutropenia ([place]) 10/19/2014    Past Surgical History:  Procedure Laterality Date   AXILLARY LYMPH NODE BIOPSY Right 2001   benign mass   BONE MARROW BIOPSY     BREAST BIOPSY Right 12/07/2017   BENIGN, REACTIVE APPEARING LYMPH NODE WITH REACTIVE    CESAREAN SECTION  2004; 2010   WISDOM TOOTH EXTRACTION      OB History     Gravida  4   Para  3   Term  3   Preterm      AB  1   Living  3      SAB  1   IAB      Ectopic      Multiple      Live Births  3            Home Medications    Prior to Admission medications   Medication Sig Start Date End Date Taking? Authorizing Provider  azithromycin (ZITHROMAX) 250 MG tablet Take 1 tablet (250 mg total) by mouth daily. Take first 2 tablets together, then 1 every day until finished. 02/01/21  Yes Sharion Balloon, NP  predniSONE (STERAPRED UNI-PAK 21 TAB) 10 MG (21) TBPK tablet Take by mouth  daily. As directed 02/01/21  Yes Sharion Balloon, NP  albuterol (VENTOLIN HFA) 108 (90 Base) MCG/ACT inhaler TAKE 2 PUFFS BY MOUTH EVERY 6 HOURS AS NEEDED FOR WHEEZE OR SHORTNESS OF BREATH 11/09/20   Baity, Coralie Keens, NP  HUMIRA PEN 40 MG/0.4ML PNKT 40 mg every 14 (fourteen) days. 06/12/19   [provider]  ibuprofen (ADVIL,MOTRIN) 200 MG tablet Take 600 mg by mouth every morning.    [provider]  levonorgestrel (MIRENA) 20 MCG/24HR IUD 1 each by Intrauterine route once. Inserted 01/2016    [provider]  levothyroxine (SYNTHROID) 25 MCG tablet TAKE 1 TABLET BY MOUTH EVERY DAY BEFORE BREAKFAST 01/12/21   Jearld Fenton, NP  meloxicam (MOBIC) 15 MG tablet TAKE 1 TABLET BY MOUTH EVERY DAY 02/20/18   Jearld Fenton, NP    Family History Family History  Problem Relation Age of Onset   Cancer Mother 90       kidney-renal cell   Stroke Father    Hypertension Father    Asthma Maternal Grandmother  Hypertension Maternal Grandmother    Stroke Paternal Grandmother    Breast cancer Neg Hx     Social History Social History   Tobacco Use   Smoking status: Former    Types: Cigarettes    Quit date: 01/18/1997    Years since quitting: 24.0   Smokeless tobacco: Never  Vaping Use   Vaping Use: Never used  Substance Use Topics   Alcohol use: Yes    Alcohol/week: 0.0 standard drinks   Drug use: No     Allergies   Bactrim [sulfamethoxazole-trimethoprim], Ceftin [cefuroxime axetil], Reglan [metoclopramide], Sulfa antibiotics, and Rocephin [ceftriaxone sodium in dextrose]   Review of Systems Review of Systems  Constitutional:  Positive for fever. Negative for chills.  HENT:  Positive for postnasal drip and sore throat. Negative for ear pain.   Respiratory:  Positive for cough and shortness of breath.   Cardiovascular:  Negative for chest pain and palpitations.  Gastrointestinal:  Negative for diarrhea and vomiting.  Skin:  Negative for color change and rash.   All other systems reviewed and are negative.   Physical Exam Triage Vital Signs ED Triage Vitals  Enc Vitals Group     BP      Pulse      Resp      Temp      Temp src      SpO2      Weight      Height      Head Circumference      Peak Flow      Pain Score      Pain Loc      Pain Edu?      Excl. in [place]?    No data found.  Updated Vital Signs BP 138/84 (BP Location: Left Arm)    Pulse 81    Temp 98.4 F (36.9 C)    Resp 18    SpO2 99%   Visual Acuity Right Eye Distance:   Left Eye Distance:   Bilateral Distance:    Right Eye Near:   Left Eye Near:    Bilateral Near:     Physical Exam Vitals and nursing note reviewed.  Constitutional:      General: She is not in acute distress.    Appearance: Normal appearance. She is well-developed. She is not ill-appearing.  HENT:     Right Ear: Tympanic membrane normal.     Left Ear: Tympanic membrane normal.     Nose: Congestion present.     Mouth/Throat:     Mouth: Mucous membranes are moist.     Pharynx: Oropharynx is clear.  Cardiovascular:     Rate and Rhythm: Normal rate and regular rhythm.     Heart sounds: Normal heart sounds.  Pulmonary:     Effort: Pulmonary effort is normal. No respiratory distress.     Breath sounds: Rhonchi present.  Musculoskeletal:     Cervical back: Neck supple.  Skin:    General: Skin is warm and dry.  Neurological:     Mental Status: She is alert.  Psychiatric:        Mood and Affect: Mood normal.        Behavior: Behavior normal.     UC Treatments / Results  Labs (all labs ordered are listed, but only abnormal results are displayed) Labs Reviewed - No data to display  EKG   Radiology DG Chest 2 View  Result Date: 02/01/2021 CLINICAL DATA:  Productive cough for 7 days. EXAM:  CHEST - 2 VIEW COMPARISON:  Chest x-ray 05/27/2020. FINDINGS: The heart size and mediastinal contours are within normal limits. Both lungs are clear. The visualized skeletal structures are  unremarkable. Right axillary surgical clips are present. IMPRESSION: No active cardiopulmonary disease. Electronically Signed   By: Ronney Asters M.D.   On: 02/01/2021 19:24    Procedures Procedures (including critical care time)  Medications Ordered in UC Medications - No data to display  Initial Impression / Assessment and Plan / UC Course  I have reviewed the triage vital signs and the nursing notes.  Pertinent labs & imaging results that were available during my care of the patient were reviewed by me and considered in my medical decision making (see chart for details).    Acute bronchitis, acute sinusitis.  Patient has been symptomatic for 7 days and is not improving with OTC treatment.  Chest x-ray normal.  Instructed her to continue using her albuterol inhaler.  Treating with Zithromax and prednisone.  Instructed patient to follow-up with her PCP if her symptoms are not improving.  ED precautions discussed.  Patient agrees to plan of care.  Final Clinical Impressions(s) / UC Diagnoses   Final diagnoses:  Acute bronchitis, unspecified organism  Acute non-recurrent maxillary sinusitis     Discharge Instructions      Take the Zithromax and prednisone as directed.  Continue to use the albuterol inhaler.  Follow up with your primary care provider if your symptoms are not improving.         ED Prescriptions     Medication Sig Dispense Auth. Provider   predniSONE (STERAPRED UNI-PAK 21 TAB) 10 MG (21) TBPK tablet Take by mouth daily. As directed 21 tablet Sharion Balloon, NP   azithromycin (ZITHROMAX) 250 MG tablet Take 1 tablet (250 mg total) by mouth daily. Take first 2 tablets together, then 1 every day until finished. 6 tablet Sharion Balloon, NP      PDMP not reviewed this encounter.   Sharion Balloon, NP 02/01/21 1939

## 2021-02-09 ENCOUNTER — Ambulatory Visit: Payer: Self-pay | Admitting: *Deleted

## 2021-02-09 NOTE — Telephone Encounter (Signed)
° °  Chief Complaint: Dry cough, chest tightness Symptoms: Cough, mild SOB, chest tightness, wheezing at times, inhalers help. Frequency: 2 weeks. Seen at [facility] 02/01/21.  Pertinent Negatives: Patient denies fever,sinus pain. Inhalers help with tightness, using more frequently Disposition: [] ED /[] Urgent Care (no appt availability in office) / [] Appointment(In office/virtual)/ [x]  [place] [facility]/ [] Home Care/ [] Refused Recommended Disposition /[] [place] [facility]/ []  Follow-up with PCP Additional Notes: Cmpleted course of prednisone and Zpack. Felt better for a week, now back. Pt will do virtual UC visit. Advised may be directed to UC walk in. HAs not been covid or flu tested. Verbalizes understanding.    Reason for Disposition  [1] MILD difficulty breathing (e.g., minimal/no SOB at rest, SOB with walking, pulse <100) AND [2] still present when not coughing  Answer Assessment - Initial Assessment Questions 1. ONSET: "When did the cough begin?"      2 weeks 2. SEVERITY: "How bad is the cough today?"      Dry, spells 3. SPUTUM: "Describe the color of your sputum" (none, dry cough; clear, white, yellow, green)     dry 4. HEMOPTYSIS: "Are you coughing up any blood?" If so ask: "How much?" (flecks, streaks, tablespoons, etc.)     NA 5. DIFFICULTY BREATHING: "Are you having difficulty breathing?" If Yes, ask: "How bad is it?" (e.g., mild, moderate, severe)    - MILD: No SOB at rest, mild SOB with walking, speaks normally in sentences, can lie down, no retractions, pulse < 100.    - MODERATE: SOB at rest, SOB with minimal exertion and prefers to sit, cannot lie down flat, speaks in phrases, mild retractions, audible wheezing, pulse 100-120.    - SEVERE: Very SOB at rest, speaks in single words, struggling to breathe, sitting hunched forward, retractions, pulse > 120      Minimal with exertion 6. FEVER: "Do you have a fever?" If Yes, ask: "What is your temperature, how was it measured,  and when did it start?"     Not presently 7. CARDIAC HISTORY: "Do you have any history of heart disease?" (e.g., heart attack, congestive heart failure)       8. LUNG HISTORY: "Do you have any history of lung disease?"  (e.g., pulmonary embolus, asthma, emphysema)      9. PE RISK FACTORS: "Do you have a history of blood clots?" (or: recent major surgery, recent prolonged travel, bedridden)      10. OTHER SYMPTOMS: "Do you have any other symptoms?" (e.g., runny nose, wheezing, chest pain)       Chest tightness, wheezing at times, inhalers help  Protocols used: Cough - Acute Non-Productive-A-AH

## 2021-02-11 NOTE — Telephone Encounter (Signed)
Agree with urgent care evaluation

## 2021-03-07 ENCOUNTER — Encounter: Payer: Self-pay | Admitting: Obstetrics and Gynecology

## 2021-03-07 ENCOUNTER — Ambulatory Visit (INDEPENDENT_AMBULATORY_CARE_PROVIDER_SITE_OTHER): Payer: Commercial Managed Care - PPO | Admitting: Obstetrics and Gynecology

## 2021-03-07 ENCOUNTER — Ambulatory Visit
Admission: RE | Admit: 2021-03-07 | Discharge: 2021-03-07 | Disposition: A | Discharge status: Home / Self Care | Payer: Commercial Managed Care - PPO | Source: Ambulatory Visit | Attending: Internal Medicine | Admitting: Internal Medicine

## 2021-03-07 ENCOUNTER — Other Ambulatory Visit: Payer: Self-pay

## 2021-03-07 ENCOUNTER — Other Ambulatory Visit (HOSPITAL_COMMUNITY)
Admission: RE | Admit: 2021-03-07 | Discharge: 2021-03-07 | Disposition: A | Discharge status: Home / Self Care | Payer: Commercial Managed Care - PPO | Source: Ambulatory Visit | Attending: Obstetrics and Gynecology | Admitting: Obstetrics and Gynecology

## 2021-03-07 VITALS — BP 110/74 | Ht 64.0 in | Wt 168.0 lb

## 2021-03-07 DIAGNOSIS — Z30431 Encounter for routine checking of intrauterine contraceptive device: Secondary | ICD-10-CM

## 2021-03-07 DIAGNOSIS — Z1151 Encounter for screening for human papillomavirus (HPV): Secondary | ICD-10-CM | POA: Insufficient documentation

## 2021-03-07 DIAGNOSIS — Z124 Encounter for screening for malignant neoplasm of cervix: Secondary | ICD-10-CM

## 2021-03-07 DIAGNOSIS — Z1211 Encounter for screening for malignant neoplasm of colon: Secondary | ICD-10-CM

## 2021-03-07 DIAGNOSIS — Z1231 Encounter for screening mammogram for malignant neoplasm of breast: Secondary | ICD-10-CM | POA: Diagnosis present

## 2021-03-07 DIAGNOSIS — Z01419 Encounter for gynecological examination (general) (routine) without abnormal findings: Secondary | ICD-10-CM

## 2021-03-07 NOTE — Patient Instructions (Signed)
I value your feedback and you entrusting us with your care. If you get a [facility] patient survey, I would appreciate you taking the time to let us know about your experience today. Thank you! ? ? ?

## 2021-03-07 NOTE — Progress Notes (Signed)
PCP:  Jearld Fenton, NP   Chief Complaint  Patient presents with   Gynecologic Exam    No concerns     HPI:      Ms. Jenny Castillo is a 46 y.o. [phone] who LMP was No LMP recorded. (Menstrual status: IUD)., presents today for her NP> 3 yrs annual examination.  Her menses are absent with IUD.  Dysmenorrhea none. She does not have intermenstrual bleeding.  Sex activity: single partner, contraception - IUD. Mirena placed 12/03/15. No pain/bleeding. Has dryness, hasn't tried lubricants. Last Pap: December 03, 2015  Results were: no abnormalities /neg HPV DNA  Hx of STDs: none  Last mammogram: today; Cat 0, needs addl imaging RT breast. S/p RT breast erythema/infection 8/18 and enlarged RT breast lymph nodes/RT axillary node with neg bx 2019, possibly thought LAN due to autoimmune issues. There is no FH of breast cancer. There is no FH of ovarian cancer. The patient does not do self-breast exams.  Tobacco use: The patient denies current or previous tobacco use. Alcohol use: social drinker No drug use.  Exercise: moderately active  Colonoscopy: never; getting sched with PCP  She does get adequate calcium and Vitamin D in her diet.  Labs with PCP.    Past Medical History:  Diagnosis Date   Abnormal white blood cell (WBC)    low; white blood cell disorder   Ankylosing spondylitis (HCC)    Chronic fatigue    Galactorrhea    Immune neutropenia (HCC)    Thyroid nodule     Past Surgical History:  Procedure Laterality Date   AXILLARY LYMPH NODE BIOPSY Right 2001   benign mass   BONE MARROW BIOPSY     BREAST BIOPSY Right 12/07/2017   BENIGN, REACTIVE APPEARING LYMPH NODE WITH REACTIVE    CESAREAN SECTION  2004; 2010   WISDOM TOOTH EXTRACTION      Family History  Problem Relation Age of Onset   Cancer Mother 37       kidney-renal cell   Stroke Father    Hypertension Father    Asthma Maternal Grandmother    Hypertension Maternal Grandmother    Stroke Paternal  Grandmother    Breast cancer Neg Hx     Social History   Socioeconomic History   Marital status: Married    Spouse name: Not on file   Number of children: 3   Years of education: 14   Highest education level: Not on file  Occupational History   Occupation: insurance  Tobacco Use   Smoking status: Former    Types: Cigarettes    Quit date: 01/18/1997    Years since quitting: 24.1   Smokeless tobacco: Never  Vaping Use   Vaping Use: Never used  Substance and Sexual Activity   Alcohol use: Yes    Alcohol/week: 0.0 standard drinks   Drug use: No   Sexual activity: Yes  Other Topics Concern   Not on file  Social History Narrative   Not on file   Social Determinants of Health   Financial Resource Strain: Not on file  Food Insecurity: Not on file  Transportation Needs: Not on file  Physical Activity: Not on file  Stress: Not on file  Social Connections: Not on file  Intimate Partner Violence: Not on file    Current Meds  Medication Sig   albuterol (VENTOLIN HFA) 108 (90 Base) MCG/ACT inhaler TAKE 2 PUFFS BY MOUTH EVERY 6 HOURS AS NEEDED FOR WHEEZE OR SHORTNESS OF BREATH  HUMIRA PEN 40 MG/0.4ML PNKT 40 mg every 14 (fourteen) days.   ibuprofen (ADVIL,MOTRIN) 200 MG tablet Take 600 mg by mouth every morning.   levonorgestrel (MIRENA) 20 MCG/24HR IUD 1 each by Intrauterine route once. Inserted 01/2016   levothyroxine (SYNTHROID) 25 MCG tablet TAKE 1 TABLET BY MOUTH EVERY DAY BEFORE BREAKFAST   meloxicam (MOBIC) 15 MG tablet TAKE 1 TABLET BY MOUTH EVERY DAY     ROS:  Review of Systems  Constitutional:  Negative for fatigue, fever and unexpected weight change.  Respiratory:  Negative for cough, shortness of breath and wheezing.   Cardiovascular:  Negative for chest pain, palpitations and leg swelling.  Gastrointestinal:  Negative for blood in stool, constipation, diarrhea, nausea and vomiting.  Endocrine: Negative for cold intolerance, heat intolerance and polyuria.   Genitourinary:  Negative for dyspareunia, dysuria, flank pain, frequency, genital sores, hematuria, menstrual problem, pelvic pain, urgency, vaginal bleeding, vaginal discharge and vaginal pain.  Musculoskeletal:  Positive for arthralgias. Negative for back pain, joint swelling and myalgias.  Skin:  Negative for rash.  Neurological:  Negative for dizziness, syncope, light-headedness, numbness and headaches.  Hematological:  Negative for adenopathy.  Psychiatric/Behavioral:  Negative for agitation, confusion, sleep disturbance and suicidal ideas. The patient is not nervous/anxious.     Objective: BP 110/74    Ht $R'5\' 4"'Jn$  (1.626 m)    Wt 168 lb (76.2 kg)    BMI 28.84 kg/m    Physical Exam Constitutional:      Appearance: She is well-developed.  Genitourinary:     Vulva normal.     Right Labia: No rash, tenderness or lesions.    Left Labia: No tenderness, lesions or rash.    No vaginal discharge, erythema or tenderness.      Right Adnexa: not tender and no mass present.    Left Adnexa: not tender and no mass present.    No cervical motion tenderness, friability or polyp.     IUD strings visualized.     Uterus is not enlarged or tender.  Breasts:    Right: No mass, nipple discharge, skin change or tenderness.     Left: No mass, nipple discharge, skin change or tenderness.  Neck:     Thyroid: No thyromegaly.  Cardiovascular:     Rate and Rhythm: Normal rate and regular rhythm.     Heart sounds: Normal heart sounds. No murmur heard. Pulmonary:     Effort: Pulmonary effort is normal.     Breath sounds: Normal breath sounds.  Abdominal:     Palpations: Abdomen is soft.     Tenderness: There is no abdominal tenderness. There is no guarding or rebound.  Musculoskeletal:        General: Normal range of motion.     Cervical back: Normal range of motion.  Lymphadenopathy:     Cervical: No cervical adenopathy.     Upper Body:     Right upper body: Axillary adenopathy present.   Neurological:     General: No focal deficit present.     Mental Status: She is alert and oriented to person, place, and time.     Cranial Nerves: No cranial nerve deficit.  Skin:    General: Skin is warm and dry.  Psychiatric:        Mood and Affect: Mood normal.        Behavior: Behavior normal.        Thought Content: Thought content normal.        Judgment: Judgment  normal.  Vitals reviewed.    Assessment/Plan: Encounter for annual routine gynecological examination  Cervical cancer screening - Plan: Cytology - PAP  Screening for HPV (human papillomavirus) - Plan: Cytology - PAP  Encounter for routine checking of intrauterine contraceptive device (IUD); Mirena placed 11/17; has 8 yr indication  Encounter for screening mammogram for malignant neoplasm of breast; Cat 0 mammo today, needs RT breast f/u  Screening for colon cancer--PCP doing ref to GI for scr colonoscopy due to age  GYN counsel mammography screening, adequate intake of calcium and vitamin D, diet and exercise     F/U  Return in about 1 year (around 03/07/2022).  Journey Castonguay B. Emmalyne Giacomo, PA-C 03/07/2021 4:35 PM

## 2021-03-08 ENCOUNTER — Other Ambulatory Visit: Payer: Self-pay | Admitting: Internal Medicine

## 2021-03-08 DIAGNOSIS — R928 Other abnormal and inconclusive findings on diagnostic imaging of breast: Secondary | ICD-10-CM

## 2021-03-08 DIAGNOSIS — N6310 Unspecified lump in the right breast, unspecified quadrant: Secondary | ICD-10-CM

## 2021-03-09 LAB — CYTOLOGY - PAP
Adequacy: ABSENT
Comment: NEGATIVE
Diagnosis: NEGATIVE
High risk HPV: NEGATIVE

## 2021-03-14 ENCOUNTER — Inpatient Hospital Stay: Admission: RE | Admit: 2021-03-14 | Payer: Commercial Managed Care - PPO | Source: Ambulatory Visit

## 2021-03-22 ENCOUNTER — Other Ambulatory Visit: Payer: Self-pay

## 2021-03-22 ENCOUNTER — Ambulatory Visit
Admission: RE | Admit: 2021-03-22 | Discharge: 2021-03-22 | Disposition: A | Discharge status: Home / Self Care | Payer: Commercial Managed Care - PPO | Source: Ambulatory Visit | Attending: Internal Medicine | Admitting: Internal Medicine

## 2021-03-22 DIAGNOSIS — R928 Other abnormal and inconclusive findings on diagnostic imaging of breast: Secondary | ICD-10-CM | POA: Diagnosis not present

## 2021-03-22 DIAGNOSIS — N6310 Unspecified lump in the right breast, unspecified quadrant: Secondary | ICD-10-CM | POA: Insufficient documentation

## 2021-03-24 ENCOUNTER — Other Ambulatory Visit: Payer: Self-pay | Admitting: Internal Medicine

## 2021-03-24 DIAGNOSIS — R928 Other abnormal and inconclusive findings on diagnostic imaging of breast: Secondary | ICD-10-CM

## 2021-03-24 DIAGNOSIS — R599 Enlarged lymph nodes, unspecified: Secondary | ICD-10-CM

## 2021-04-06 ENCOUNTER — Ambulatory Visit
Admission: RE | Admit: 2021-04-06 | Discharge: 2021-04-06 | Disposition: A | Discharge status: Home / Self Care | Payer: Commercial Managed Care - PPO | Source: Ambulatory Visit | Attending: Internal Medicine | Admitting: Internal Medicine

## 2021-04-06 ENCOUNTER — Other Ambulatory Visit: Payer: Self-pay

## 2021-04-06 ENCOUNTER — Other Ambulatory Visit: Payer: Self-pay | Admitting: Internal Medicine

## 2021-04-06 DIAGNOSIS — R928 Other abnormal and inconclusive findings on diagnostic imaging of breast: Secondary | ICD-10-CM | POA: Diagnosis present

## 2021-04-06 DIAGNOSIS — R599 Enlarged lymph nodes, unspecified: Secondary | ICD-10-CM

## 2021-04-06 HISTORY — PX: BREAST BIOPSY: SHX20

## 2021-04-08 ENCOUNTER — Encounter: Payer: Self-pay | Admitting: Diagnostic Radiology

## 2021-04-08 LAB — SURGICAL PATHOLOGY

## 2021-04-16 ENCOUNTER — Other Ambulatory Visit: Payer: Self-pay | Admitting: Internal Medicine

## 2021-04-18 NOTE — Telephone Encounter (Signed)
Requested Prescriptions  ?Pending Prescriptions Disp Refills  ?? levothyroxine (SYNTHROID) 25 MCG tablet [Pharmacy Med Name: LEVOTHYROXINE 25 MCG TABLET] 90 tablet 2  ?  Sig: TAKE 1 TABLET BY MOUTH EVERY DAY BEFORE BREAKFAST  ?  ? Endocrinology:  Hypothyroid Agents Passed - 04/16/2021  1:42 AM  ?  ?  Passed - TSH in normal range and within 360 days  ?  TSH  ?Date Value Ref Range Status  ?01/13/2021 2.43 mIU/L Final  ?  Comment:  ?            Reference Range ?. ?          > or = 20 Years  0.40-4.50 ?. ?               Pregnancy Ranges ?          First trimester    0.26-2.66 ?          Second trimester   0.55-2.73 ?          Third trimester    0.43-2.91 ?  ?   ?  ?  Passed - Valid encounter within last 12 months  ?  Recent Outpatient Visits   ?      ? 3 months ago Encounter for general adult medical examination with abnormal findings  ? [facility] [place], [place], NP  ? 10 months ago Chronic cough  ? [facility] [place], Salvadore Oxford, NP  ?  ?  ? ?  ?  ?  ? ? ?

## 2021-12-01 ENCOUNTER — Ambulatory Visit (INDEPENDENT_AMBULATORY_CARE_PROVIDER_SITE_OTHER): Payer: Commercial Managed Care - PPO | Admitting: Internal Medicine

## 2021-12-01 ENCOUNTER — Encounter: Payer: Self-pay | Admitting: Internal Medicine

## 2021-12-01 VITALS — BP 132/84 | HR 78 | Temp 97.3°F | Wt 172.0 lb

## 2021-12-01 DIAGNOSIS — R509 Fever, unspecified: Secondary | ICD-10-CM | POA: Diagnosis not present

## 2021-12-01 DIAGNOSIS — R61 Generalized hyperhidrosis: Secondary | ICD-10-CM

## 2021-12-01 DIAGNOSIS — D709 Neutropenia, unspecified: Secondary | ICD-10-CM | POA: Diagnosis not present

## 2021-12-01 LAB — CBC WITH DIFFERENTIAL/PLATELET
Absolute Monocytes: 640 cells/uL (ref 200–950)
Basophils Absolute: 39 cells/uL (ref 0–200)
Basophils Relative: 1 %
Eosinophils Absolute: 82 cells/uL (ref 15–500)
Eosinophils Relative: 2.1 %
HCT: 39 % (ref 35.0–45.0)
Hemoglobin: 13.2 g/dL (ref 11.7–15.5)
Lymphs Abs: 2024 cells/uL (ref 850–3900)
MCH: 31 pg (ref 27.0–33.0)
MCHC: 33.8 g/dL (ref 32.0–36.0)
MCV: 91.5 fL (ref 80.0–100.0)
MPV: 10.8 fL (ref 7.5–12.5)
Monocytes Relative: 16.4 %
Neutro Abs: 1115 cells/uL — ABNORMAL LOW (ref 1500–7800)
Neutrophils Relative %: 28.6 %
Platelets: 216 10*3/uL (ref 140–400)
RBC: 4.26 10*6/uL (ref 3.80–5.10)
RDW: 12 % (ref 11.0–15.0)
Total Lymphocyte: 51.9 %
WBC: 3.9 10*3/uL (ref 3.8–10.8)

## 2021-12-01 NOTE — Patient Instructions (Signed)
Fever, Adult     A fever is an increase in your body's temperature. It often means a temperature of 100.4F (38C) or higher. Brief mild or moderate fevers often have no long-term effects. They often do not need treatment. Moderate or high fevers may make you feel uncomfortable. Sometimes, they can be a sign of a serious illness or disease. A fever that keeps coming back or that lasts a long time may cause you to lose water in your body (get dehydrated). You can take your temperature with a thermometer to see if you have a fever. Temperature can change with: Age. Time of day. Where the thermometer is put in the body. Readings may vary when the thermometer is put: In the mouth (oral). In the butt (rectal). In the ear (tympanic). Under the arm (axillary). On the forehead (temporal). Follow these instructions at home: Medicines Take over-the-counter and prescription medicines only as told by your doctor. Follow the dosing instructions carefully. If you were prescribed an antibiotic medicine, take it as told by your doctor. Do not stop taking it even if you start to feel better. General instructions Watch for any changes in your symptoms. Tell your doctor about them. Rest as needed. Drink enough fluid to keep your pee (urine) pale yellow. Sponge yourself or bathe with room-temperature water as needed. This helps to lower your body temperature. Do not use ice water. Do not use too many blankets or wear clothes that are too heavy. If your fever was caused by an infection that spreads from person to person (is contagious), such as a cold or the flu: You should stay home from work and public places for at least 24 hours after your fever is gone. Your fever should be gone for at least 24 hours without the need to use medicines. Contact a doctor if: You throw up (vomit). You cannot eat or drink without throwing up. You have watery poop (diarrhea). It hurts when you pee. Your symptoms do not get  better with treatment. You have new symptoms. You feel very weak. Get help right away if: You are short of breath or have trouble breathing. You are dizzy or you pass out (faint). You feel mixed up (confused). You have signs of not having enough water in your body, such as: Dark pee, very little pee, or no pee. Cracked lips. Dry mouth. Sunken eyes. Sleepiness. Weakness. You have very bad pain in your belly (abdomen). You keep throwing up or having watery poop. You have a rash on your skin. Your symptoms get worse all of a sudden. Summary A fever is an increase in your body's temperature. It often means a temperature of 100.4F (38C) or higher. Watch for any changes in your symptoms. Tell your doctor about them. Take all medicines only as told by your doctor. Do not go to work or other public places if your fever was caused by an illness that can spread to other people. Get help right away if you have signs that you do not have enough water in your body. This information is not intended to replace advice given to you by your health care provider. Make sure you discuss any questions you have with your health care provider. Document Revised: 05/09/2021 Document Reviewed: 06/01/2020 Elsevier Patient Education  2023 Elsevier Inc.  

## 2021-12-01 NOTE — Addendum Note (Signed)
Addended by: Lorre Munroe on: 12/01/2021 03:38 PM   Modules accepted: Orders

## 2021-12-01 NOTE — Progress Notes (Signed)
Subjective:    Patient ID: Jenny Castillo, female    DOB: December 26, 1975, 46 y.o.   MRN: [id number]  HPI  Patient presents to clinic today with complaint of night sweats and low-grade fever.  She reports this started a few weeks ago. She denies headache, runny nose, nasal congestion, ear pain, sore throat, cough, shortness of breath. She denies fever, chills or body aches. She has a history of immune neutropenia.  Review of Systems     Past Medical History:  Diagnosis Date   Abnormal white blood cell (WBC)    low; white blood cell disorder   Ankylosing spondylitis (HCC)    Chronic fatigue    Galactorrhea    Immune neutropenia (HCC)    Thyroid nodule     Current Outpatient Medications  Medication Sig Dispense Refill   albuterol (VENTOLIN HFA) 108 (90 Base) MCG/ACT inhaler TAKE 2 PUFFS BY MOUTH EVERY 6 HOURS AS NEEDED FOR WHEEZE OR SHORTNESS OF BREATH 8.5 each 2   HUMIRA PEN 40 MG/0.4ML PNKT 40 mg every 14 (fourteen) days.     ibuprofen (ADVIL,MOTRIN) 200 MG tablet Take 600 mg by mouth every morning.     levonorgestrel (MIRENA) 20 MCG/24HR IUD 1 each by Intrauterine route once. Inserted 01/2016     levothyroxine (SYNTHROID) 25 MCG tablet TAKE 1 TABLET BY MOUTH EVERY DAY BEFORE BREAKFAST 90 tablet 2   meloxicam (MOBIC) 15 MG tablet TAKE 1 TABLET BY MOUTH EVERY DAY 30 tablet 2   No current facility-administered medications for this visit.    Allergies  Allergen Reactions   Bactrim [Sulfamethoxazole-Trimethoprim] Other (See Comments)    Has a low blood count has to stay away from certain medications    Ceftin [Cefuroxime Axetil] Other (See Comments)   Reglan [Metoclopramide] Other (See Comments)    Heavy pressure in chest , can't breathe   Sulfa Antibiotics Other (See Comments)   Rocephin [Ceftriaxone Sodium In Dextrose] Rash    Swelling     Family History  Problem Relation Age of Onset   Cancer Mother 49       kidney-renal cell   Stroke Father    Hypertension Father    Asthma  Maternal Grandmother    Hypertension Maternal Grandmother    Stroke Paternal Grandmother    Breast cancer Neg Hx     Social History   Socioeconomic History   Marital status: Married    Spouse name: Not on file   Number of children: 3   Years of education: 14   Highest education level: Not on file  Occupational History   Occupation: insurance  Tobacco Use   Smoking status: Former    Types: Cigarettes    Quit date: 01/18/1997    Years since quitting: 24.8   Smokeless tobacco: Never  Vaping Use   Vaping Use: Never used  Substance and Sexual Activity   Alcohol use: Yes    Alcohol/week: 0.0 standard drinks of alcohol   Drug use: No   Sexual activity: Yes  Other Topics Concern   Not on file  Social History Narrative   Not on file   Social Determinants of Health   Financial Resource Strain: Not on file  Food Insecurity: Not on file  Transportation Needs: Not on file  Physical Activity: Insufficiently Active (12/20/2016)   Exercise Vital Sign    Days of Exercise per Week: 2 days    Minutes of Exercise per Session: 30 min  Stress: No Stress Concern Present (12/20/2016)  [employer]    Feeling of Stress : Not at all  Social Connections: Socially Integrated (12/20/2016)   Social Connection and Isolation Panel [NHANES]    Frequency of Communication with Friends and Family: More than three times a week    Frequency of Social Gatherings with Friends and Family: Twice a week    Attends Religious Services: More than 4 times per year    Active Member of [employer] or Organizations: Yes    Attends Archivist Meetings: 1 to 4 times per year    Marital Status: Married  Human resources officer Violence: Not At Risk (12/20/2016)   Humiliation, Afraid, Rape, and Kick questionnaire    Fear of Current or Ex-Partner: No    Emotionally Abused: No    Physically Abused: No    Sexually Abused: No     Constitutional:  Patient reports fevers.  Denies malaise, fatigue, headache or abrupt weight changes.  HEENT: Denies eye pain, eye redness, ear pain, ringing in the ears, wax buildup, runny nose, nasal congestion, bloody nose, or sore throat. Respiratory: Denies difficulty breathing, shortness of breath, cough or sputum production.   Cardiovascular: Denies chest pain, chest tightness, palpitations or swelling in the hands or feet.  Gastrointestinal: Denies abdominal pain, bloating, constipation, diarrhea or blood in the stool.  GU: Denies urgency, frequency, pain with urination, burning sensation, blood in urine, odor or discharge. Musculoskeletal: Denies decrease in range of motion, difficulty with gait, muscle pain or joint pain and swelling.  Skin: Denies redness, rashes, lesions or ulcercations.  Neurological: Patient reports night sweats.  Denies dizziness, difficulty with memory, difficulty with speech or problems with balance and coordination.  No other specific complaints in a complete review of systems (except as listed in HPI above).  Objective:   Physical Exam  BP 132/84 (BP Location: Left Arm, Patient Position: Sitting, Cuff Size: Normal)   Pulse 78   Temp (!) 97.3 F (36.3 C) (Temporal)   Wt 172 lb (78 kg)   SpO2 99%   BMI 29.52 kg/m   Wt Readings from Last 3 Encounters:  03/07/21 168 lb (76.2 kg)  01/13/21 165 lb 6.4 oz (75 kg)  08/23/20 162 lb 4.8 oz (73.6 kg)    General: Appears her stated age, overweight in NAD. Skin: Warm, dry and intact.  HEENT: Head: normal shape and size; Eyes: sclera white, no icterus, conjunctiva pink, PERRLA and EOMs intact;  Cardiovascular: Normal rate and rhythm. S1,S2 noted.  No murmur, rubs or gallops noted.  Pulmonary/Chest: Normal effort and positive vesicular breath sounds. No respiratory distress. No wheezes, rales or ronchi noted.  Neurological: Alert and oriented.    Lipid Panel     Component Value Date/Time   CHOL 157 01/13/2021 0848   TRIG  102 01/13/2021 0848   HDL 51 01/13/2021 0848   CHOLHDL 3.1 01/13/2021 0848   VLDL 14.4 12/08/2019 1519   LDLCALC 86 01/13/2021 0848    CBC    Component Value Date/Time   WBC 3.3 (L) 01/13/2021 0848   RBC 4.23 01/13/2021 0848   HGB 13.2 01/13/2021 0848   HCT 39.2 01/13/2021 0848   PLT 191 01/13/2021 0848   MCV 92.7 01/13/2021 0848   MCH 31.2 01/13/2021 0848   MCHC 33.7 01/13/2021 0848   RDW 12.5 01/13/2021 0848   LYMPHSABS 1,188 01/13/2021 0848   MONOABS 0.5 01/19/2020 1141   EOSABS 79 01/13/2021 0848   BASOSABS 30 01/13/2021 0848  Hgb A1C Lab Results  Component Value Date   HGBA1C 5.5 01/13/2021           Assessment & Plan:   Fever, Night Sweats, Immune Neutropenia:  CBC with diff today She will follow up with hematology as needed  RTC in 1 month for your annual exam Webb Silversmith, NP

## 2022-01-18 ENCOUNTER — Other Ambulatory Visit: Payer: Self-pay | Admitting: Internal Medicine

## 2022-01-19 NOTE — Telephone Encounter (Signed)
Requested medication (s) are due for refill today: Yes  Requested medication (s) are on the active medication list: Yes  Last refill:  03/29/21  Future visit scheduled: No  Notes to clinic:  Unable to refill per protocol due to failed labs, no updated results.      Requested Prescriptions  Pending Prescriptions Disp Refills   levothyroxine (SYNTHROID) 25 MCG tablet [Pharmacy Med Name: LEVOTHYROXINE 25 MCG TABLET] 90 tablet 2    Sig: TAKE 1 TABLET BY MOUTH EVERY DAY BEFORE BREAKFAST     Endocrinology:  Hypothyroid Agents Failed - 01/18/2022  2:18 AM      Failed - TSH in normal range and within 360 days    TSH  Date Value Ref Range Status  01/13/2021 2.43 mIU/L Final    Comment:              Reference Range .           > or = 20 Years  0.40-4.50 .                Pregnancy Ranges           First trimester    0.26-2.66           Second trimester   0.55-2.73           Third trimester    0.43-2.91          Passed - Valid encounter within last 12 months    Recent Outpatient Visits           1 month ago Immune neutropenia [facility])   [facility] [place], Salvadore Oxford, NP   1 year ago Encounter for general adult medical examination with abnormal findings   [facility] [place], Salvadore Oxford, NP   1 year ago Chronic cough   [facility] [place], Salvadore Oxford, [place]

## 2022-02-02 ENCOUNTER — Other Ambulatory Visit: Payer: Self-pay | Admitting: Internal Medicine

## 2022-02-02 DIAGNOSIS — Z1231 Encounter for screening mammogram for malignant neoplasm of breast: Secondary | ICD-10-CM

## 2022-03-01 ENCOUNTER — Telehealth: Payer: Self-pay

## 2022-03-01 ENCOUNTER — Other Ambulatory Visit: Payer: Self-pay

## 2022-03-01 ENCOUNTER — Encounter: Payer: Self-pay | Admitting: Internal Medicine

## 2022-03-01 ENCOUNTER — Ambulatory Visit (INDEPENDENT_AMBULATORY_CARE_PROVIDER_SITE_OTHER): Payer: Commercial Managed Care - PPO | Admitting: Internal Medicine

## 2022-03-01 VITALS — BP 132/80 | HR 63 | Temp 96.9°F | Ht 64.0 in | Wt 176.0 lb

## 2022-03-01 DIAGNOSIS — E039 Hypothyroidism, unspecified: Secondary | ICD-10-CM | POA: Diagnosis not present

## 2022-03-01 DIAGNOSIS — Z1211 Encounter for screening for malignant neoplasm of colon: Secondary | ICD-10-CM

## 2022-03-01 DIAGNOSIS — R7303 Prediabetes: Secondary | ICD-10-CM | POA: Diagnosis not present

## 2022-03-01 DIAGNOSIS — Z0001 Encounter for general adult medical examination with abnormal findings: Secondary | ICD-10-CM

## 2022-03-01 DIAGNOSIS — H9201 Otalgia, right ear: Secondary | ICD-10-CM

## 2022-03-01 DIAGNOSIS — Z6830 Body mass index (BMI) 30.0-30.9, adult: Secondary | ICD-10-CM

## 2022-03-01 DIAGNOSIS — E6609 Other obesity due to excess calories: Secondary | ICD-10-CM

## 2022-03-01 MED ORDER — NA SULFATE-K SULFATE-MG SULF 17.5-3.13-1.6 GM/177ML PO SOLN: 1.0000 | Freq: Once | ORAL | 0 refills | Status: AC

## 2022-03-01 MED ORDER — ALBUTEROL SULFATE HFA 108 (90 BASE) MCG/ACT IN AERS: INHALATION_SPRAY | RESPIRATORY_TRACT | 2 refills | Status: DC

## 2022-03-01 NOTE — Assessment & Plan Note (Signed)
Encourage diet and exercise for weight loss 

## 2022-03-01 NOTE — Patient Instructions (Signed)

## 2022-03-01 NOTE — Telephone Encounter (Signed)
Gastroenterology Pre-Procedure Review  Request Date: 04/14/22 Requesting Physician: Dr. Marius Ditch  PATIENT REVIEW QUESTIONS: The patient responded to the following health history questions as indicated:    1. Are you having any GI issues? no 2. Do you have a personal history of Polyps? no 3. Do you have a family history of Colon Cancer or Polyps? no 4. Diabetes Mellitus? no 5. Joint replacements in the past 12 months?no 6. Major health problems in the past 3 months?no 7. Any artificial heart valves, MVP, or defibrillator?no    MEDICATIONS & ALLERGIES:    Patient reports the following regarding taking any anticoagulation/antiplatelet therapy:   Plavix, Coumadin, Eliquis, Xarelto, Lovenox, Pradaxa, Brilinta, or Effient? no Aspirin? no  Patient confirms/reports the following medications:  Current Outpatient Medications  Medication Sig Dispense Refill   albuterol (VENTOLIN HFA) 108 (90 Base) MCG/ACT inhaler TAKE 2 PUFFS BY MOUTH EVERY 6 HOURS AS NEEDED FOR WHEEZE OR SHORTNESS OF BREATH 8.5 each 2   HUMIRA PEN 40 MG/0.4ML PNKT 40 mg every 14 (fourteen) days.     ibuprofen (ADVIL,MOTRIN) 200 MG tablet Take 600 mg by mouth every morning.     levonorgestrel (MIRENA) 20 MCG/24HR IUD 1 each by Intrauterine route once. Inserted 01/2016     levothyroxine (SYNTHROID) 25 MCG tablet TAKE 1 TABLET BY MOUTH EVERY DAY BEFORE BREAKFAST 90 tablet 0   meloxicam (MOBIC) 15 MG tablet TAKE 1 TABLET BY MOUTH EVERY DAY 30 tablet 2   No current facility-administered medications for this visit.    Patient confirms/reports the following allergies:  Allergies  Allergen Reactions   Bactrim [Sulfamethoxazole-Trimethoprim] Other (See Comments)    Has a low blood count has to stay away from certain medications    Ceftin [Cefuroxime Axetil] Other (See Comments)   Reglan [Metoclopramide] Other (See Comments)    Heavy pressure in chest , can't breathe   Sulfa Antibiotics Other (See Comments)   Rocephin [Ceftriaxone  Sodium In Dextrose] Rash    Swelling     No orders of the defined types were placed in this encounter.   AUTHORIZATION INFORMATION Primary Insurance: 1D#: Group #:  Secondary Insurance: 1D#: Group #:  SCHEDULE INFORMATION: Date: 04/14/22 Time: Location: [facility]

## 2022-03-01 NOTE — Progress Notes (Signed)
Subjective:    Patient ID: Jenny Castillo, female    DOB: [birth year], 47 y.o.   MRN: [id number]  HPI  Patient presents to clinic today for annual exam.  Flu: 10/2021 Tetanus: unsure COVID: Never Pap smear: 02/2021 Mammogram: 02/2021, schedule 02/2022 Colon screening: never Vision screening: annually Dentist: biannually  Diet: She does eat meat. She consumes fruits and veggies. She does eat some fried foods. She drinks mostly coffee, iced tea, and water. Exercise: treadmill 5 x week  Review of Systems     Past Medical History:  Diagnosis Date   Abnormal white blood cell (WBC)    low; white blood cell disorder   Ankylosing spondylitis (HCC)    Chronic fatigue    Galactorrhea    Immune neutropenia (HCC)    Thyroid nodule     Current Outpatient Medications  Medication Sig Dispense Refill   albuterol (VENTOLIN HFA) 108 (90 Base) MCG/ACT inhaler TAKE 2 PUFFS BY MOUTH EVERY 6 HOURS AS NEEDED FOR WHEEZE OR SHORTNESS OF BREATH 8.5 each 2   HUMIRA PEN 40 MG/0.4ML PNKT 40 mg every 14 (fourteen) days.     ibuprofen (ADVIL,MOTRIN) 200 MG tablet Take 600 mg by mouth every morning.     levonorgestrel (MIRENA) 20 MCG/24HR IUD 1 each by Intrauterine route once. Inserted 01/2016     levothyroxine (SYNTHROID) 25 MCG tablet TAKE 1 TABLET BY MOUTH EVERY DAY BEFORE BREAKFAST 90 tablet 0   meloxicam (MOBIC) 15 MG tablet TAKE 1 TABLET BY MOUTH EVERY DAY (Patient not taking: Reported on 12/01/2021) 30 tablet 2   No current facility-administered medications for this visit.    Allergies  Allergen Reactions   Bactrim [Sulfamethoxazole-Trimethoprim] Other (See Comments)    Has a low blood count has to stay away from certain medications    Ceftin [Cefuroxime Axetil] Other (See Comments)   Reglan [Metoclopramide] Other (See Comments)    Heavy pressure in chest , can't breathe   Sulfa Antibiotics Other (See Comments)   Rocephin [Ceftriaxone Sodium In Dextrose] Rash    Swelling     Family History   Problem Relation Age of Onset   Cancer Mother 46       kidney-renal cell   Stroke Father    Hypertension Father    Asthma Maternal Grandmother    Hypertension Maternal Grandmother    Stroke Paternal Grandmother    Breast cancer Neg Hx     Social History   Socioeconomic History   Marital status: Married    Spouse name: Not on file   Number of children: 3   Years of education: 14   Highest education level: Not on file  Occupational History   Occupation: insurance  Tobacco Use   Smoking status: Former    Types: Cigarettes    Quit date: 01/18/1997    Years since quitting: 25.1   Smokeless tobacco: Never  Vaping Use   Vaping Use: Never used  Substance and Sexual Activity   Alcohol use: Yes    Alcohol/week: 0.0 standard drinks of alcohol   Drug use: No   Sexual activity: Yes  Other Topics Concern   Not on file  Social History Narrative   Not on file   Social Determinants of Health   Financial Resource Strain: Not on file  Food Insecurity: Not on file  Transportation Needs: Not on file  Physical Activity: Insufficiently Active (12/20/2016)   Exercise Vital Sign    Days of Exercise per Week: 2 days    Minutes  of Exercise per Session: 30 min  Stress: No Stress Concern Present (12/20/2016)   [place]    Feeling of Stress : Not at all  Social Connections: [place] (12/20/2016)   Social Connection and Isolation Panel [NHANES]    Frequency of Communication with Friends and Family: More than three times a week    Frequency of Social Gatherings with Friends and Family: Twice a week    Attends Religious Services: More than 4 times per year    Active Member of [employer] or Organizations: Yes    Attends Archivist Meetings: 1 to 4 times per year    Marital Status: Married  Human resources officer Violence: Not At Risk (12/20/2016)   Humiliation, Afraid, Rape, and Kick questionnaire    Fear of  Current or Ex-Partner: No    Emotionally Abused: No    Physically Abused: No    Sexually Abused: No     Constitutional: Denies fever, malaise, fatigue, headache or abrupt weight changes.  HEENT: Pt reports thumping sensation in right ear. Denies eye pain, eye redness, ear pain, ringing in the ears, wax buildup, runny nose, nasal congestion, bloody nose, or sore throat. Respiratory: Denies difficulty breathing, shortness of breath, cough or sputum production.   Cardiovascular: Denies chest pain, chest tightness, palpitations or swelling in the hands or feet.  Gastrointestinal: Pt reports intermittent reflux. Denies abdominal pain, bloating, constipation, diarrhea or blood in the stool.  GU: Denies urgency, frequency, pain with urination, burning sensation, blood in urine, odor or discharge. Musculoskeletal: Pt reports intermittent joint pain. Denies decrease in range of motion, difficulty with gait, muscle pain or joint swelling.  Skin: Denies redness, rashes, lesions or ulcercations.  Neurological: Denies dizziness, difficulty with memory, difficulty with speech or problems with balance and coordination.  Psych: Denies anxiety, depression, SI/HI.  No other specific complaints in a complete review of systems (except as listed in HPI above).  Objective:   Physical Exam  BP 132/80 (BP Location: Left Arm, Patient Position: Sitting, Cuff Size: Normal)   Pulse 63   Temp (!) 96.9 F (36.1 C) (Temporal)   Ht 5\' 4"  (1.626 m)   Wt 176 lb (79.8 kg)   SpO2 97%   BMI 30.21 kg/m   Wt Readings from Last 3 Encounters:  12/01/21 172 lb (78 kg)  03/07/21 168 lb (76.2 kg)  01/13/21 165 lb 6.4 oz (75 kg)    General: Appears her stated age, obese, in NAD. Skin: Warm, dry and intact. No rashes noted. HEENT: Head: normal shape and size; Eyes: sclera white, no icterus, conjunctiva pink, PERRLA and EOMs intact; Ears: Tm's gray and intact, normal light reflex;  Neck:  Neck supple, trachea midline. No  masses, lumps or thyromegaly present.  Cardiovascular: Normal rate and rhythm. S1,S2 noted.  No murmur, rubs or gallops noted. No JVD or BLE edema. Pulmonary/Chest: Normal effort and positive vesicular breath sounds. No respiratory distress. No wheezes, rales or ronchi noted.  Abdomen: Normal bowel sounds.  Musculoskeletal: Strength 5/5 BUE/BLE. No difficulty with gait.  Neurological: Alert and oriented. Cranial nerves II-XII grossly intact. Coordination normal.  Psychiatric: Mood and affect normal. Behavior is normal. Judgment and thought content normal.     BMET    Component Value Date/Time   NA 139 01/13/2021 0848   K 4.0 01/13/2021 0848   CL 104 01/13/2021 0848   CO2 28 01/13/2021 0848   GLUCOSE 90 01/13/2021 0848   BUN 9 01/13/2021  0848   CREATININE 0.74 01/13/2021 0848   CALCIUM 8.9 01/13/2021 0848   GFRNONAA >60 08/23/2020 1112    Lipid Panel     Component Value Date/Time   CHOL 157 01/13/2021 0848   TRIG 102 01/13/2021 0848   HDL 51 01/13/2021 0848   CHOLHDL 3.1 01/13/2021 0848   VLDL 14.4 12/08/2019 1519   LDLCALC 86 01/13/2021 0848    CBC    Component Value Date/Time   WBC 3.9 12/01/2021 1539   RBC 4.26 12/01/2021 1539   HGB 13.2 12/01/2021 1539   HCT 39.0 12/01/2021 1539   PLT 216 12/01/2021 1539   MCV 91.5 12/01/2021 1539   MCH 31.0 12/01/2021 1539   MCHC 33.8 12/01/2021 1539   RDW 12.0 12/01/2021 1539   LYMPHSABS 2,024 12/01/2021 1539   MONOABS 0.5 01/19/2020 1141   EOSABS 82 12/01/2021 1539   BASOSABS 39 12/01/2021 1539    Hgb A1C Lab Results  Component Value Date   HGBA1C 5.5 01/13/2021           Assessment & Plan:   Preventative Health Maintenance:  Encouraged her to get a flu shot in the fall She declines Tetanus booster Encouraged her to get her COVID-vaccine Pap smear UTD Mammogram scheduled Referral to GI for screening colonoscopy Encouraged her to consume a balanced diet and exercise regimen Advised her to see an eye  doctor and dentist annually Will check CBC, CMET, TSH, Free T4, lipid and A1C today  Right Ear Problem:  Referral to ENT for further evaluation  RTC in 6 months for her annual exam Webb Silversmith, NP

## 2022-03-02 ENCOUNTER — Encounter: Payer: Self-pay | Admitting: Internal Medicine

## 2022-03-02 LAB — CBC
HCT: 41.5 % (ref 35.0–45.0)
Hemoglobin: 14.2 g/dL (ref 11.7–15.5)
MCH: 31.3 pg (ref 27.0–33.0)
MCHC: 34.2 g/dL (ref 32.0–36.0)
MCV: 91.6 fL (ref 80.0–100.0)
MPV: 10.8 fL (ref 7.5–12.5)
Platelets: 207 10*3/uL (ref 140–400)
RBC: 4.53 10*6/uL (ref 3.80–5.10)
RDW: 11.8 % (ref 11.0–15.0)
WBC: 3 10*3/uL — ABNORMAL LOW (ref 3.8–10.8)

## 2022-03-02 LAB — COMPLETE METABOLIC PANEL WITH GFR
AG Ratio: 1.3 (calc) (ref 1.0–2.5)
ALT: 19 U/L (ref 6–29)
AST: 24 U/L (ref 10–35)
Albumin: 4.7 g/dL (ref 3.6–5.1)
Alkaline phosphatase (APISO): 70 U/L (ref 31–125)
BUN: 11 mg/dL (ref 7–25)
CO2: 27 mmol/L (ref 20–32)
Calcium: 9.6 mg/dL (ref 8.6–10.2)
Chloride: 103 mmol/L (ref 98–110)
Creat: 0.8 mg/dL (ref 0.50–0.99)
Globulin: 3.6 g/dL (calc) (ref 1.9–3.7)
Glucose, Bld: 88 mg/dL (ref 65–99)
Potassium: 4.3 mmol/L (ref 3.5–5.3)
Sodium: 141 mmol/L (ref 135–146)
Total Bilirubin: 0.3 mg/dL (ref 0.2–1.2)
Total Protein: 8.3 g/dL — ABNORMAL HIGH (ref 6.1–8.1)
eGFR: 91 mL/min/{1.73_m2} (ref >=60)

## 2022-03-02 LAB — LIPID PANEL
Cholesterol: 158 mg/dL (ref <200)
HDL: 49 mg/dL — ABNORMAL LOW (ref >=50)
LDL Cholesterol (Calc): 86 mg/dL (calc)
Non-HDL Cholesterol (Calc): 109 mg/dL (calc) (ref <130)
Total CHOL/HDL Ratio: 3.2 (calc) (ref <5.0)
Triglycerides: 135 mg/dL (ref <150)

## 2022-03-02 LAB — T4, FREE: Free T4: 1 ng/dL (ref 0.8–1.8)

## 2022-03-02 LAB — HEMOGLOBIN A1C
Hgb A1c MFr Bld: 5.9 % of total Hgb — ABNORMAL HIGH (ref <5.7)
Mean Plasma Glucose: 123 mg/dL
eAG (mmol/L): 6.8 mmol/L

## 2022-03-02 LAB — TSH: TSH: 3.28 mIU/L

## 2022-03-09 ENCOUNTER — Ambulatory Visit
Admission: RE | Admit: 2022-03-09 | Discharge: 2022-03-09 | Disposition: A | Discharge status: Home / Self Care | Payer: Commercial Managed Care - PPO | Source: Ambulatory Visit | Attending: Internal Medicine | Admitting: Internal Medicine

## 2022-03-09 DIAGNOSIS — Z1231 Encounter for screening mammogram for malignant neoplasm of breast: Secondary | ICD-10-CM | POA: Diagnosis not present

## 2022-03-17 ENCOUNTER — Other Ambulatory Visit: Payer: Self-pay | Admitting: Otolaryngology

## 2022-03-17 DIAGNOSIS — H93A1 Pulsatile tinnitus, right ear: Secondary | ICD-10-CM

## 2022-03-31 ENCOUNTER — Encounter: Payer: Self-pay | Admitting: Gastroenterology

## 2022-04-03 ENCOUNTER — Ambulatory Visit
Admission: RE | Admit: 2022-04-03 | Discharge: 2022-04-03 | Disposition: A | Discharge status: Home / Self Care | Payer: Commercial Managed Care - PPO | Attending: Gastroenterology | Admitting: Gastroenterology

## 2022-04-03 ENCOUNTER — Ambulatory Visit: Payer: Commercial Managed Care - PPO | Admitting: Certified Registered"

## 2022-04-03 ENCOUNTER — Encounter
Admission: RE | Disposition: A | Discharge status: Home / Self Care | Payer: Self-pay | Source: Home / Self Care | Attending: Gastroenterology

## 2022-04-03 ENCOUNTER — Encounter: Payer: Self-pay | Admitting: Gastroenterology

## 2022-04-03 DIAGNOSIS — Z87891 Personal history of nicotine dependence: Secondary | ICD-10-CM | POA: Insufficient documentation

## 2022-04-03 DIAGNOSIS — Z1211 Encounter for screening for malignant neoplasm of colon: Secondary | ICD-10-CM | POA: Diagnosis present

## 2022-04-03 DIAGNOSIS — E039 Hypothyroidism, unspecified: Secondary | ICD-10-CM | POA: Insufficient documentation

## 2022-04-03 HISTORY — PX: COLONOSCOPY WITH PROPOFOL: SHX5780

## 2022-04-03 LAB — POCT PREGNANCY, URINE: Preg Test, Ur: NEGATIVE

## 2022-04-03 SURGERY — COLONOSCOPY WITH PROPOFOL
Anesthesia: General

## 2022-04-03 MED ORDER — SODIUM CHLORIDE 0.9 % IV SOLN
INTRAVENOUS | Status: DC
  Administered 2022-04-03: 1000 mL via INTRAVENOUS

## 2022-04-03 MED ORDER — PROPOFOL 1000 MG/100ML IV EMUL
INTRAVENOUS | Status: AC
  Filled 2022-04-03: qty 100

## 2022-04-03 MED ORDER — PROPOFOL 500 MG/50ML IV EMUL
INTRAVENOUS | Status: DC | PRN
  Administered 2022-04-03: 150 ug/kg/min via INTRAVENOUS

## 2022-04-03 MED ORDER — SODIUM CHLORIDE 0.9 % IV SOLN: INTRAVENOUS | Status: DC | PRN

## 2022-04-03 MED ORDER — PROPOFOL 10 MG/ML IV BOLUS
INTRAVENOUS | Status: DC | PRN
  Administered 2022-04-03: 50 mg via INTRAVENOUS
  Administered 2022-04-03: 3 mg via INTRAVENOUS

## 2022-04-03 NOTE — Anesthesia Preprocedure Evaluation (Signed)
Anesthesia Evaluation  Patient identified by MRN, date of birth, ID band Patient awake    Reviewed: Allergy & Precautions, NPO status , Patient's Chart, lab work & pertinent test results  Airway Mallampati: II  TM Distance: >3 FB Neck ROM: full    Dental  (+) Teeth Intact   Pulmonary neg pulmonary ROS, Patient abstained from smoking., former smoker   Pulmonary exam normal breath sounds clear to auscultation       Cardiovascular negative cardio ROS Normal cardiovascular exam Rhythm:Regular Rate:Normal     Neuro/Psych negative neurological ROS  negative psych ROS   GI/Hepatic negative GI ROS, Neg liver ROS,,,  Endo/Other  negative endocrine ROSHypothyroidism    Renal/GU negative Renal ROS  negative genitourinary   Musculoskeletal  (+) Arthritis ,    Abdominal Normal abdominal exam  (+)   Peds negative pediatric ROS (+)  Hematology negative hematology ROS (+)   Anesthesia Other Findings Past Medical History: No date: Abnormal white blood cell (WBC)     Comment:  low; white blood cell disorder No date: Ankylosing spondylitis (HCC) No date: Chronic fatigue No date: Galactorrhea No date: Immune neutropenia ([place]) No date: Thyroid nodule  Past Surgical History: 2001: AXILLARY LYMPH NODE BIOPSY; Right     Comment:  benign mass No date: BONE MARROW BIOPSY 12/07/2017: BREAST BIOPSY; Right     Comment:  BENIGN, REACTIVE APPEARING LYMPH NODE WITH REACTIVE  04/06/2021: BREAST BIOPSY; Right     Comment:  [place] bx neg reactive woth follicilar hyperplasia [id number]; 2010: CESAREAN SECTION No date: WISDOM TOOTH EXTRACTION  BMI    Body Mass Index: 30.21 kg/m      Reproductive/Obstetrics negative OB ROS                             Anesthesia Physical Anesthesia Plan  ASA: 2  Anesthesia Plan: General   Post-op Pain Management:    Induction: Intravenous  PONV Risk Score and Plan: Propofol  infusion and TIVA  Airway Management Planned: Natural Airway  Additional Equipment:   Intra-op Plan:   Post-operative Plan:   Informed Consent: I have reviewed the patients History and Physical, chart, labs and discussed the procedure including the risks, benefits and alternatives for the proposed anesthesia with the patient or authorized representative who has indicated his/her understanding and acceptance.     Dental Advisory Given  Plan Discussed with: CRNA and Surgeon  Anesthesia Plan Comments:        Anesthesia Quick Evaluation

## 2022-04-03 NOTE — Anesthesia Postprocedure Evaluation (Signed)
Anesthesia Post Note  Patient: Jenny Castillo  Procedure(s) Performed: COLONOSCOPY WITH PROPOFOL  Patient location during evaluation: PACU Anesthesia Type: General Level of consciousness: awake Vital Signs Assessment: post-procedure vital signs reviewed and stable Respiratory status: spontaneous breathing Cardiovascular status: stable Anesthetic complications: no   No notable events documented.   Last Vitals:  Vitals:   04/03/22 0823 04/03/22 0926  BP: (!) 118/91 111/80  Pulse: 67 68  Resp: 18 13  Temp: (!) 36.3 C (!) 36.1 C  SpO2: 100% 99%    Last Pain:  Vitals:   04/03/22 0926  TempSrc: Temporal  PainSc: Asleep                 VAN STAVEREN,Angalena Cousineau

## 2022-04-03 NOTE — Op Note (Signed)
[facility] Patient Name: Jenny Castillo Procedure Date: 04/03/2022 8:53 AM MRN: [id number] Account #: [id number] Date of Birth: 05-Aug-1975 Admit Type: Outpatient Age: 47 Room: [facility] ENDO ROOM 4 Gender: Female Note Status: Finalized Instrument Name: Jasper Riling [id number] Procedure:             Colonoscopy Indications:           Screening for colorectal malignant neoplasm, This is                         the patient's first colonoscopy Providers:             Lin Landsman MD, MD Referring MD:          Jearld Fenton (Referring MD) Medicines:             General Anesthesia Complications:         No immediate complications. Estimated blood loss: None. Procedure:             Pre-Anesthesia Assessment:                        - Prior to the procedure, a History and Physical was                         performed, and patient medications and allergies were                         reviewed. The patient is competent. The risks and                         benefits of the procedure and the sedation options and                         risks were discussed with the patient. All questions                         were answered and informed consent was obtained.                         Patient identification and proposed procedure were                         verified by the physician, the nurse, the                         anesthesiologist, the anesthetist and the technician                         in the pre-procedure area in the procedure room in the                         endoscopy suite. Mental Status Examination: alert and                         oriented. Airway Examination: normal oropharyngeal                         airway and neck mobility. Respiratory Examination:  clear to auscultation. CV Examination: normal.                         Prophylactic Antibiotics: The patient does not require                         prophylactic  antibiotics. Prior Anticoagulants: The                         patient has taken no anticoagulant or antiplatelet                         agents. ASA Grade Assessment: II - A patient with mild                         systemic disease. After reviewing the risks and                         benefits, the patient was deemed in satisfactory                         condition to undergo the procedure. The anesthesia                         plan was to use general anesthesia. Immediately prior                         to administration of medications, the patient was                         re-assessed for adequacy to receive sedatives. The                         heart rate, respiratory rate, oxygen saturations,                         blood pressure, adequacy of pulmonary ventilation, and                         response to care were monitored throughout the                         procedure. The physical status of the patient was                         re-assessed after the procedure.                        After obtaining informed consent, the colonoscope was                         passed under direct vision. Throughout the procedure,                         the patient's blood pressure, pulse, and oxygen                         saturations were monitored continuously. The  Colonoscope was introduced through the anus and                         advanced to the the cecum, identified by appendiceal                         orifice and ileocecal valve. The colonoscopy was                         performed without difficulty. The patient tolerated                         the procedure well. The quality of the bowel                         preparation was evaluated using the BBPS [facility] Bowel                         Preparation Scale) with scores of: Right Colon = 3,                         Transverse Colon = 3 and Left Colon = 3 (entire mucosa                         seen  well with no residual staining, small fragments                         of stool or opaque liquid). The total BBPS score                         equals 9. The ileocecal valve, appendiceal orifice,                         and rectum were photographed. Findings:      The perianal and digital rectal examinations were normal. Pertinent       negatives include normal sphincter tone and no palpable rectal lesions.      The entire examined colon appeared normal.      The retroflexed view of the distal rectum and anal verge was normal and       showed no anal or rectal abnormalities. Impression:            - The entire examined colon is normal.                        - The distal rectum and anal verge are normal on                         retroflexion view.                        - No specimens collected. Recommendation:        - Discharge patient to home (with escort).                        - Resume previous diet today.                        -  Continue present medications.                        - Repeat colonoscopy in 10 years for screening                         purposes. Procedure Code(s):     --- Professional ---                        [id number], Colorectal cancer screening; colonoscopy on                         individual not meeting criteria for high risk Diagnosis Code(s):     --- Professional ---                        Z12.11, Encounter for screening for malignant neoplasm                         of colon CPT copyright 2022 American Medical Association. All rights reserved. The codes documented in this report are preliminary and upon coder review may  be revised to meet current compliance requirements. Dr. Ulyess Mort Lin Landsman MD, MD 04/03/2022 9:23:52 AM This report has been signed electronically. Number of Addenda: 0 Note Initiated On: 04/03/2022 8:53 AM Scope Withdrawal Time: 0 hours 10 minutes 5 seconds  Total Procedure Duration: 0 hours 14 minutes 4 seconds   Estimated Blood Loss:  Estimated blood loss: none.      [facility]

## 2022-04-03 NOTE — Transfer of Care (Signed)
Immediate Anesthesia Transfer of Care Note  Patient: Jenny Castillo  Procedure(s) Performed: COLONOSCOPY WITH PROPOFOL  Patient Location: PACU  Anesthesia Type:MAC  Level of Consciousness: drowsy  Airway & Oxygen Therapy: Patient Spontanous Breathing  Post-op Assessment: Report given to RN and Post -op Vital signs reviewed and unstable, Anesthesiologist notified  Post vital signs: Reviewed  Last Vitals:  Vitals Value Taken Time  BP 118/78   Temp 36.1 C 04/03/22 0926  Pulse 68 04/03/22 0926  Resp 13 04/03/22 0926  SpO2 99 % 04/03/22 0926    Last Pain:  Vitals:   04/03/22 0926  TempSrc: Temporal  PainSc: Asleep         Complications: No notable events documented.

## 2022-04-03 NOTE — H&P (Signed)
Cephas Darby, MD [address]  [place]  [place], [place] [address]  Main: [phone]  Fax: [phone] Pager: [phone]  Primary Care Physician:  Jearld Fenton, NP Primary Gastroenterologist:  Dr. Cephas Darby  Pre-Procedure History & Physical: HPI:  Jenny Castillo is a 47 y.o. adult is here for an colonoscopy.   Past Medical History:  Diagnosis Date   Abnormal white blood cell (WBC)    low; white blood cell disorder   Ankylosing spondylitis (HCC)    Chronic fatigue    Galactorrhea    Immune neutropenia (HCC)    Thyroid nodule     Past Surgical History:  Procedure Laterality Date   AXILLARY LYMPH NODE BIOPSY Right 2001   benign mass   BONE MARROW BIOPSY     BREAST BIOPSY Right 12/07/2017   BENIGN, REACTIVE APPEARING LYMPH NODE WITH REACTIVE    BREAST BIOPSY Right 04/06/2021   [place] bx neg reactive woth follicilar hyperplasia   CESAREAN SECTION  2004; 2010   WISDOM TOOTH EXTRACTION      Prior to Admission medications   Medication Sig Start Date End Date Taking? Authorizing Provider  albuterol (VENTOLIN HFA) 108 (90 Base) MCG/ACT inhaler TAKE 2 PUFFS BY MOUTH EVERY 6 HOURS AS NEEDED FOR WHEEZE OR SHORTNESS OF BREATH 03/01/22  Yes Baity, Coralie Keens, NP  HUMIRA PEN 40 MG/0.4ML PNKT 40 mg every 14 (fourteen) days. 06/12/19  Yes [provider]  ibuprofen (ADVIL,MOTRIN) 200 MG tablet Take 600 mg by mouth every morning.   Yes [provider]  levonorgestrel (MIRENA) 20 MCG/24HR IUD 1 each by Intrauterine route once. Inserted 01/2016   Yes [provider]  levothyroxine (SYNTHROID) 25 MCG tablet TAKE 1 TABLET BY MOUTH EVERY DAY BEFORE BREAKFAST 01/20/22  Yes Jearld Fenton, NP  meloxicam (MOBIC) 15 MG tablet TAKE 1 TABLET BY MOUTH EVERY DAY 02/20/18  Yes Jearld Fenton, NP    Allergies as of 03/01/2022 - Review Complete 03/01/2022  Allergen Reaction Noted   Bactrim [sulfamethoxazole-trimethoprim] Other (See Comments) 08/24/2014   Ceftin  [cefuroxime axetil] Other (See Comments) 08/24/2014   Reglan [metoclopramide] Other (See Comments) 08/24/2014   Sulfa antibiotics Other (See Comments) 08/24/2014   Rocephin [ceftriaxone sodium in dextrose] Rash 08/24/2014    Family History  Problem Relation Age of Onset   Cancer Mother 62       kidney-renal cell   Stroke Father    Hypertension Father    Heart disease Father    Healthy Sister    Healthy Sister    Asthma Maternal Grandmother    Hypertension Maternal Grandmother    Stroke Paternal Grandmother    Breast cancer Neg Hx     Social History   Socioeconomic History   Marital status: Married    Spouse name: Not on file   Number of children: 3   Years of education: 14   Highest education level: Not on file  Occupational History   Occupation: insurance  Tobacco Use   Smoking status: Former    Types: Cigarettes    Quit date: 01/18/1997    Years since quitting: 25.2   Smokeless tobacco: Never  Vaping Use   Vaping Use: Never used  Substance and Sexual Activity   Alcohol use: Yes    Alcohol/week: 0.0 standard drinks of alcohol   Drug use: No   Sexual activity: Yes  Other Topics Concern   Not on file  Social History Narrative   Not on file  Social Determinants of Health   Financial Resource Strain: Not on file  Food Insecurity: Not on file  Transportation Needs: Not on file  Physical Activity: Insufficiently Active (12/20/2016)   Exercise Vital Sign    Days of Exercise per Week: 2 days    Minutes of Exercise per Session: 30 min  Stress: No Stress Concern Present (12/20/2016)   [place]    Feeling of Stress : Not at all  Social Connections: [place] (12/20/2016)   Social Connection and Isolation Panel [NHANES]    Frequency of Communication with Friends and Family: More than three times a week    Frequency of Social Gatherings with Friends and Family: Twice a week    Attends  Religious Services: More than 4 times per year    Active Member of [employer] or Organizations: Yes    Attends Archivist Meetings: 1 to 4 times per year    Marital Status: Married  Human resources officer Violence: Not At Risk (12/20/2016)   Humiliation, Afraid, Rape, and Kick questionnaire    Fear of Current or Ex-Partner: No    Emotionally Abused: No    Physically Abused: No    Sexually Abused: No    Review of Systems: See HPI, otherwise negative ROS  Physical Exam: BP (!) 118/91   Pulse 67   Temp (!) 97.3 F (36.3 C) (Temporal)   Resp 18   Ht '5\' 4"'$  (1.626 m)   Wt 79.8 kg   SpO2 100%   BMI 30.21 kg/m  General:   Alert,  pleasant and cooperative in NAD Head:  Normocephalic and atraumatic. Neck:  Supple; no masses or thyromegaly. Lungs:  Clear throughout to auscultation.    Heart:  Regular rate and rhythm. Abdomen:  Soft, nontender and nondistended. Normal bowel sounds, without guarding, and without rebound.   Neurologic:  Alert and  oriented x4;  grossly normal neurologically.  Impression/Plan: Jenny Castillo is here for an colonoscopy to be performed for colon cancer screening  Risks, benefits, limitations, and alternatives regarding  colonoscopy have been reviewed with the patient.  Questions have been answered.  All parties agreeable.   Sherri Sear, MD  04/03/2022, 8:52 AM

## 2022-04-04 ENCOUNTER — Encounter: Payer: Self-pay | Admitting: Gastroenterology

## 2022-04-05 IMAGING — US US BREAST*R* LIMITED INC AXILLA
1 series · 10 of 10 positions shown · non-contrast
Comparison: Previous exam(s).

CLINICAL DATA: 44-year-old female presenting for evaluation of a
possible enlarging lymph node in the right axilla. She had a right
axillary lymph node biopsy in Thursday November, 2017 showing fragments of
benign, reactive appearing lymph node with reactive follicular
lymphoid hyperplasia. No evidence of metastatic tumor or malignancy.
In the pathology comments, it was stated that follicular hyperplasia
is a relatively nonspecific finding, but may be seen in the setting
of autoimmune disease or certain infections. The patient does have
history of ankylosing spondylitis. She has had a right axillary
lymph node dissection in her 20s, which was also benign. Her [REDACTED]
notes also states that she has history of immune neutropenia, and
has had increased bruising for the past several months.

EXAM:
DIGITAL DIAGNOSTIC BILATERAL MAMMOGRAM WITH TOMO AND CAD; ULTRASOUND
RIGHT BREAST LIMITED

[Series 1: us breast*right* limited inc axilla · 0.08mm/px · 10 of 10 slices shown]
[im 1/10]
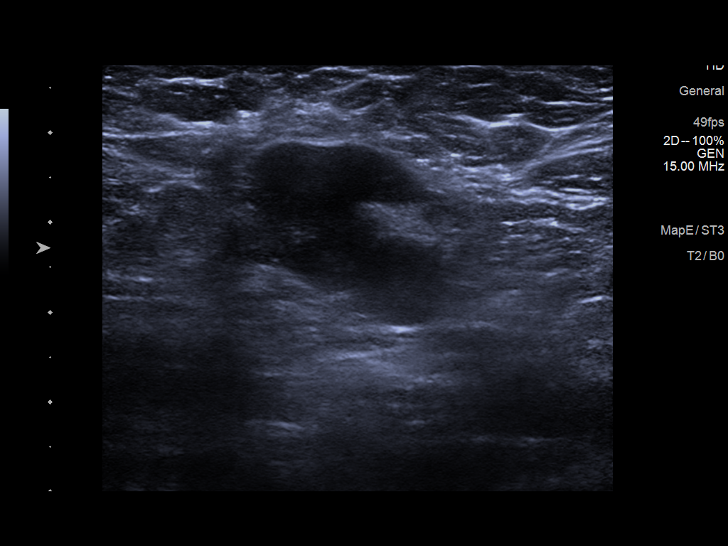
[im 2/10]
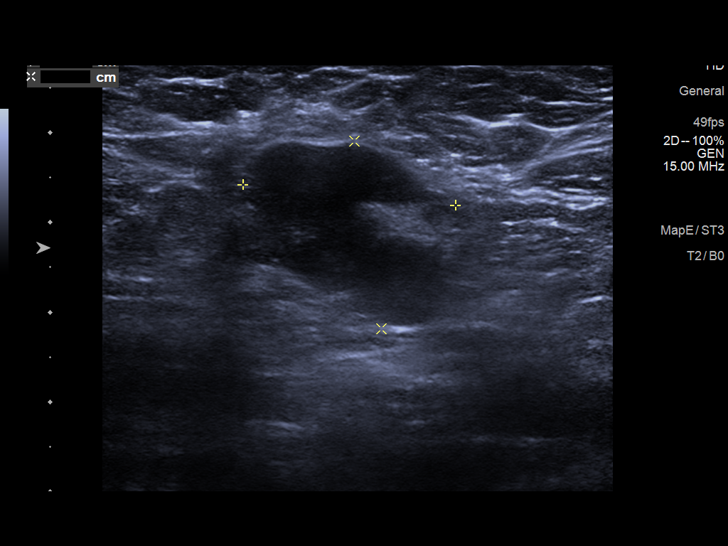
[im 3/10]
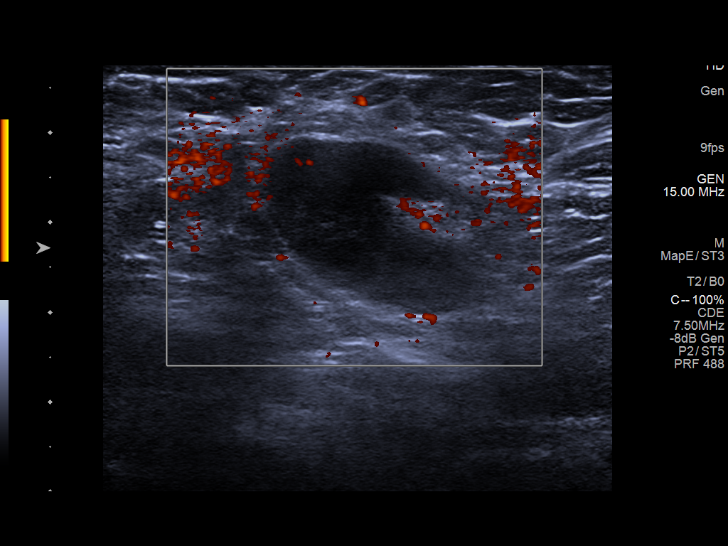
[im 4/10]
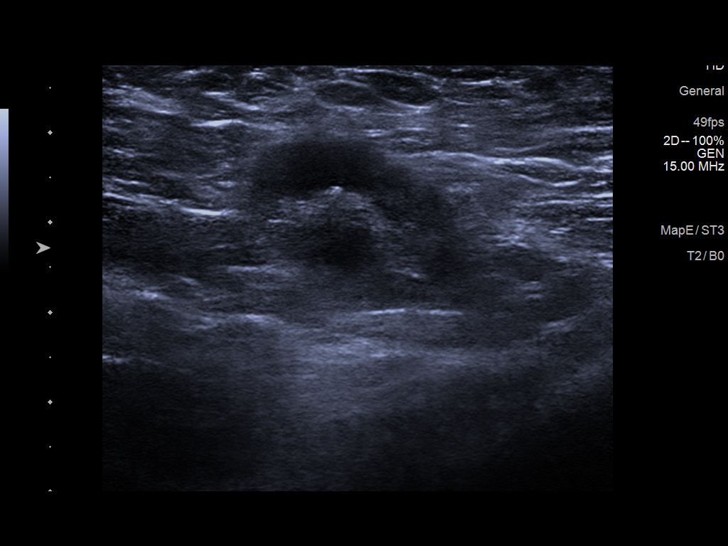
[im 5/10]
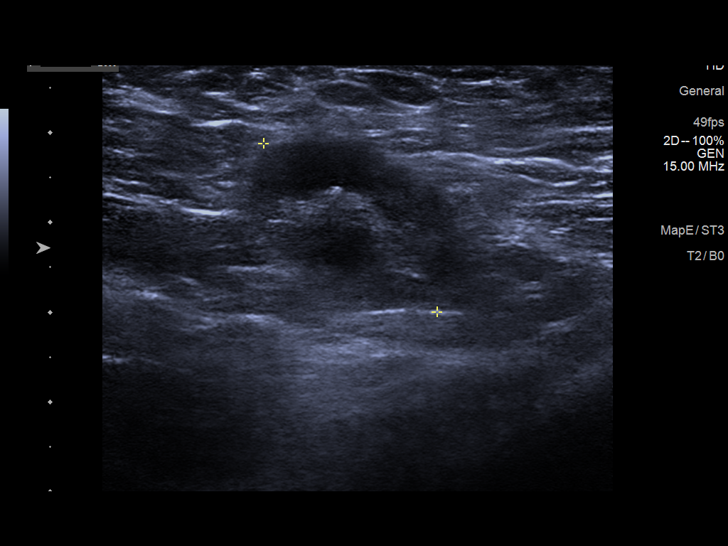
[im 6/10]
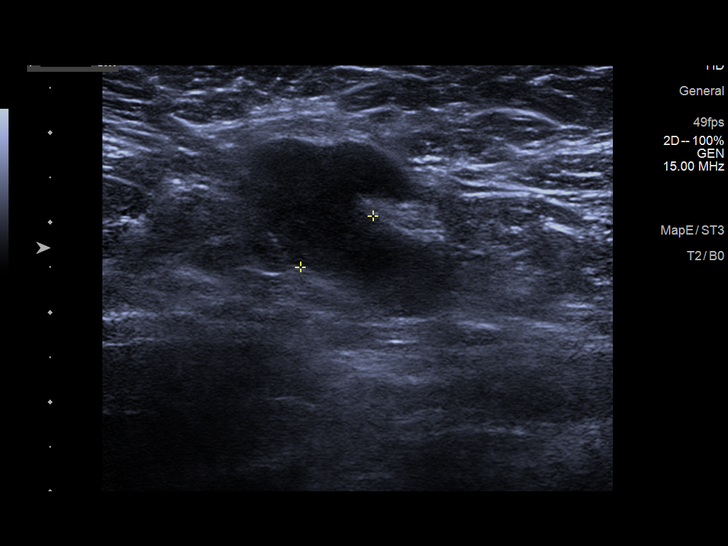
[im 7/10]
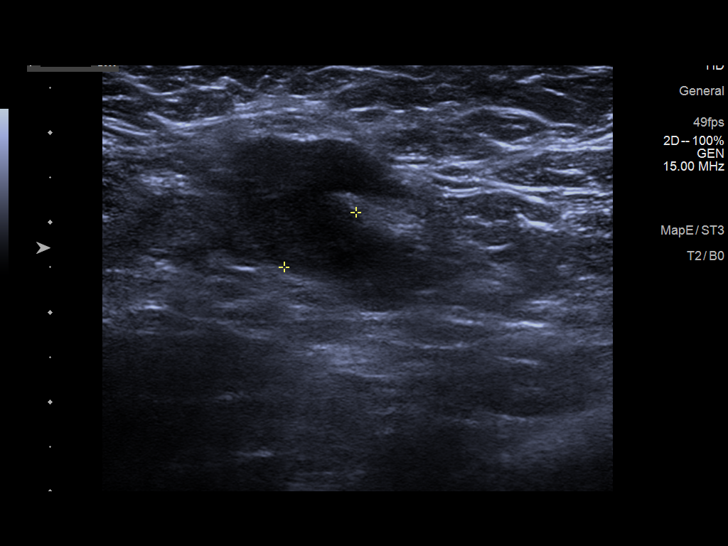
[im 8/10]
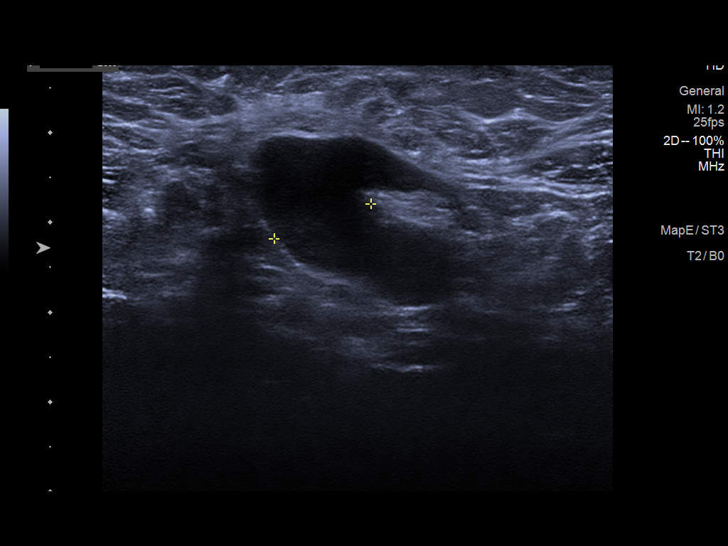
[im 9/10]
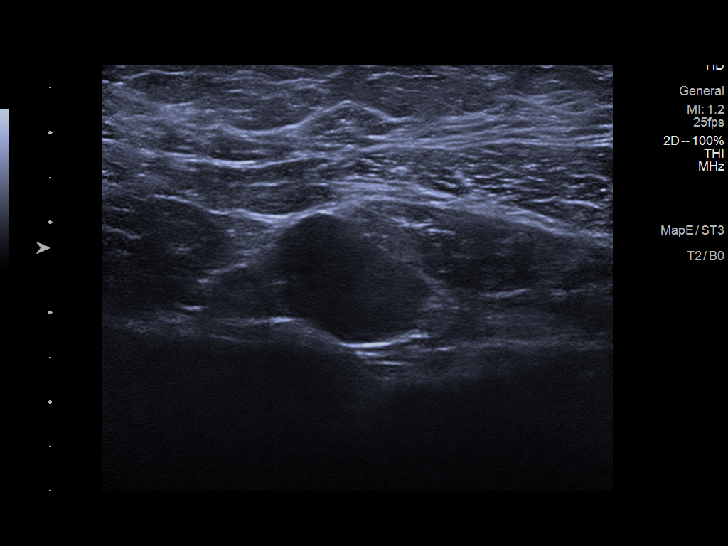
[im 10/10]
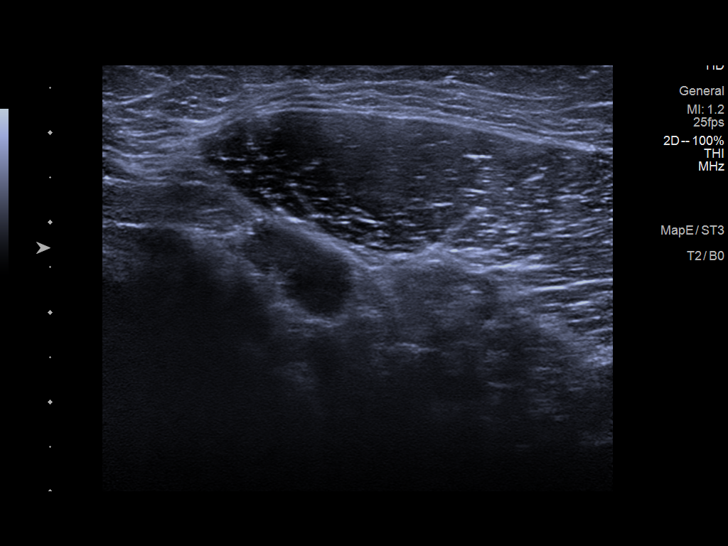

[10 of 10 positions shown; findings below may reference images not displayed]

ACR Breast Density Category c: The breast tissue is heterogeneously
dense, which may obscure small masses.
FINDINGS: The enlarged lymph node with the biopsy marking clip can be
partially visualized in the right axilla. Multiple surgical clips
are also seen in the right axilla. No suspicious calcifications,
masses or areas of distortion are seen in the bilateral breasts.

Mammographic images were processed with CAD.

Ultrasound of the right axilla demonstrates that the previously
biopsied lymph node demonstrates a fairly stable cortical thickness
of 1.1 cm, previously 1.0 cm in Thursday November, 2017. There are a couple
other prominent adjacent lymph nodes.
IMPRESSION: The biopsied benign right axillary lymph node appears fairly stable
in size. There are 2 other adjacent prominent lymph nodes, likely
related to the patient's autoimmune issues.

RECOMMENDATION:
The patient has not recently seen a rheumatologist (the provider she
saw previously at [facility] has left) or a hematologist for her
autoimmune issues/blood disorder. She is encouraged to seek out
these specialists as soon as possible. If either of these specialist
suggest repeat lymph node biopsy would be clinically useful, we will
see the patient back as soon as that is necessary. Otherwise a
three-month follow-up right axillary ultrasound is recommended.

I have discussed the findings and recommendations with the patient.
If applicable, a reminder letter will be sent to the patient
regarding the next appointment.

BI-RADS CATEGORY  3: Probably benign.

## 2022-04-05 IMAGING — MG DIGITAL DIAGNOSTIC BILAT W/ TOMO W/ CAD
6 of 10 series · 6 of 30 positions shown · non-contrast
Comparison: Previous exam(s).

CLINICAL DATA: 44-year-old female presenting for evaluation of a
possible enlarging lymph node in the right axilla. She had a right
axillary lymph node biopsy in Thursday November, 2017 showing fragments of
benign, reactive appearing lymph node with reactive follicular
lymphoid hyperplasia. No evidence of metastatic tumor or malignancy.
In the pathology comments, it was stated that follicular hyperplasia
is a relatively nonspecific finding, but may be seen in the setting
of autoimmune disease or certain infections. The patient does have
history of ankylosing spondylitis. She has had a right axillary
lymph node dissection in her 20s, which was also benign. Her [REDACTED]
notes also states that she has history of immune neutropenia, and
has had increased bruising for the past several months.

EXAM:
DIGITAL DIAGNOSTIC BILATERAL MAMMOGRAM WITH TOMO AND CAD; ULTRASOUND
RIGHT BREAST LIMITED

[R CC synth-2D]
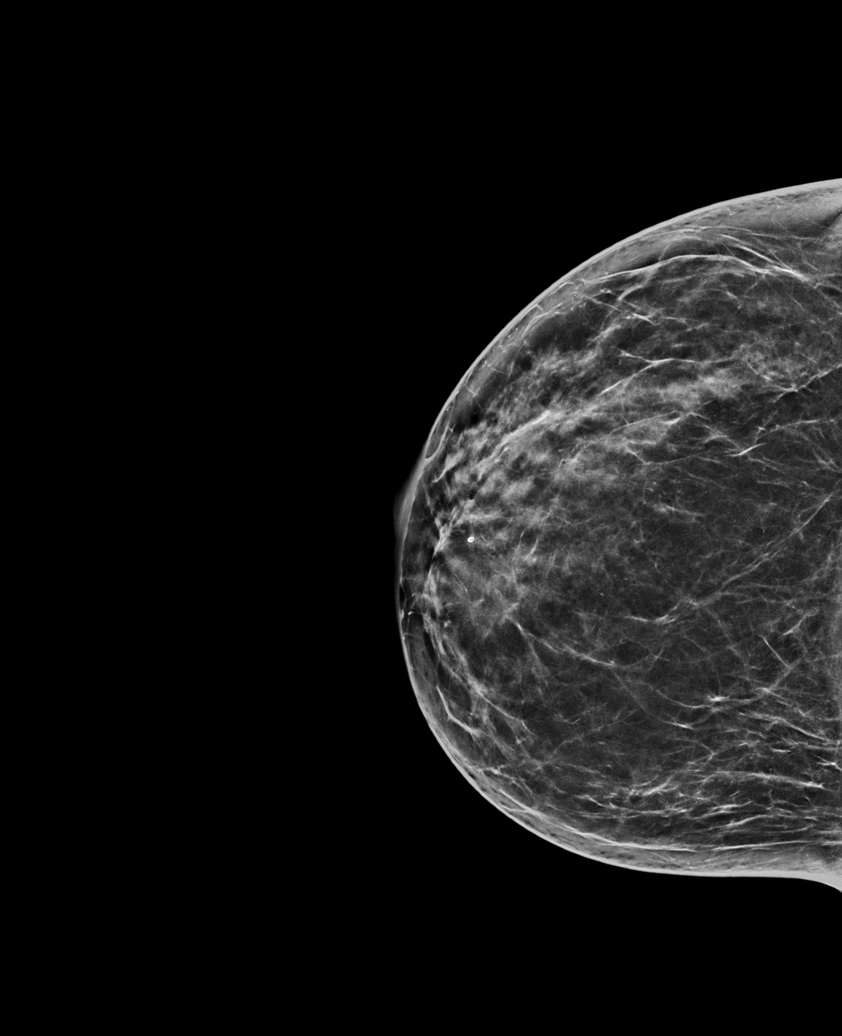

[L CC synth-2D]
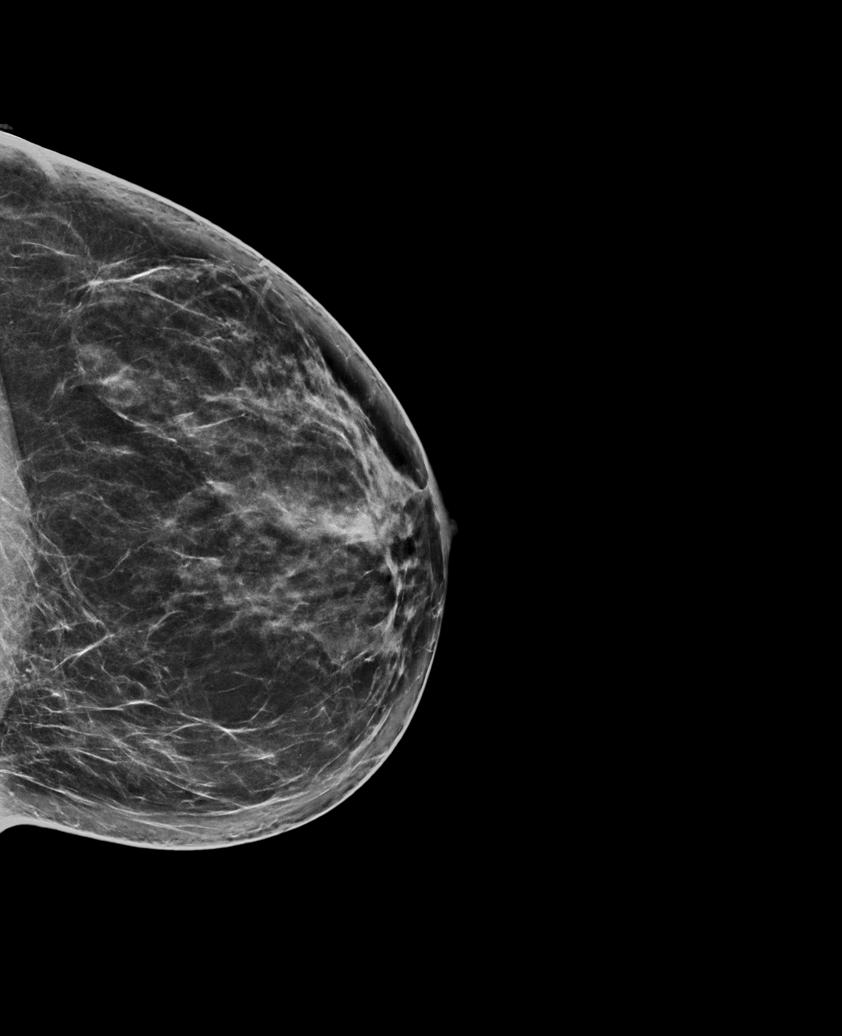

[R TAN synth-2D]
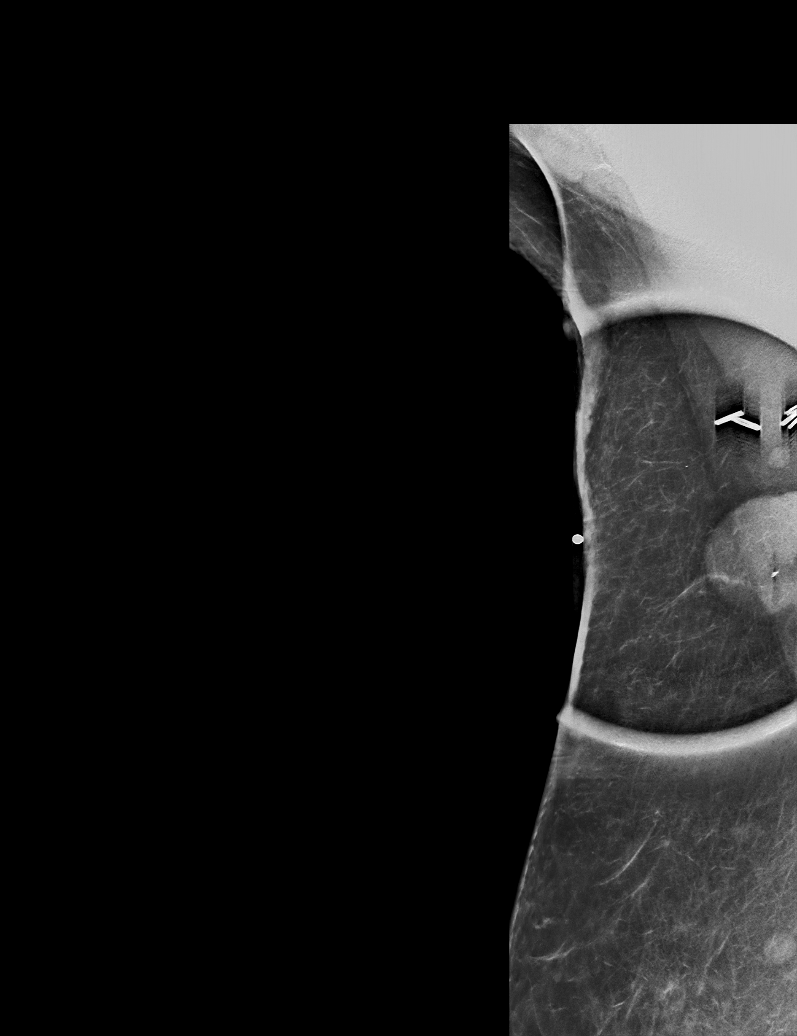

[L MLO synth-2D]
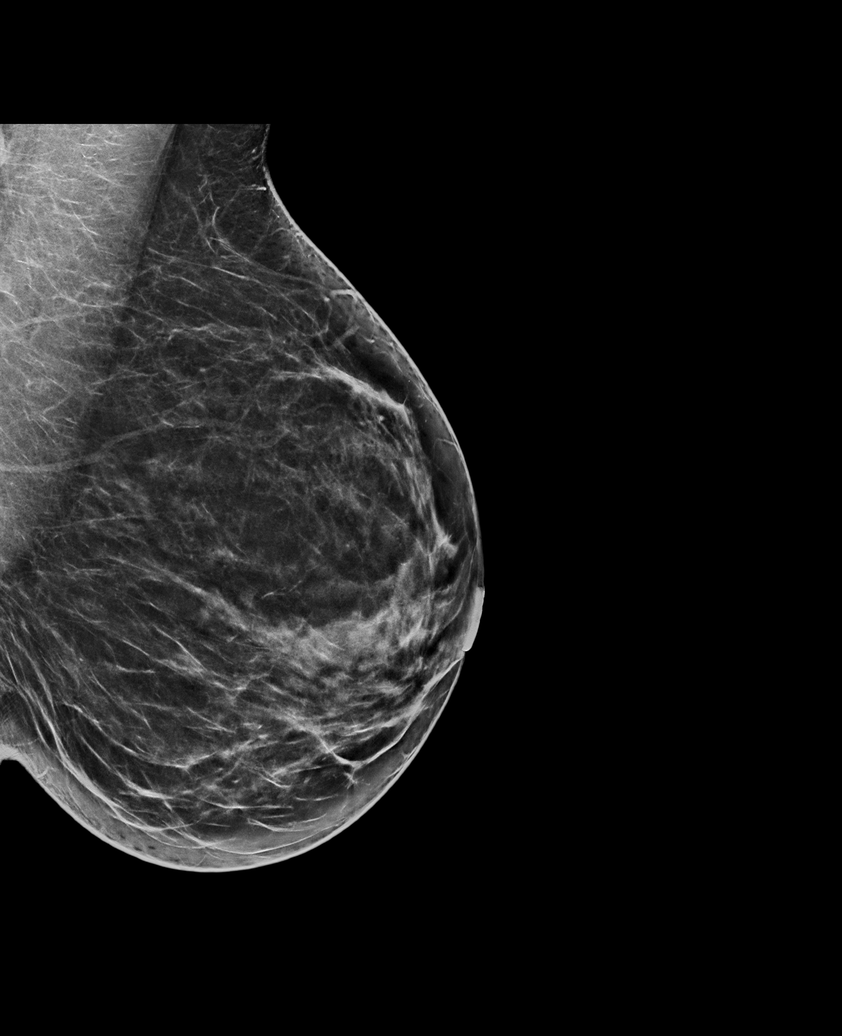

[R MLO synth-2D]
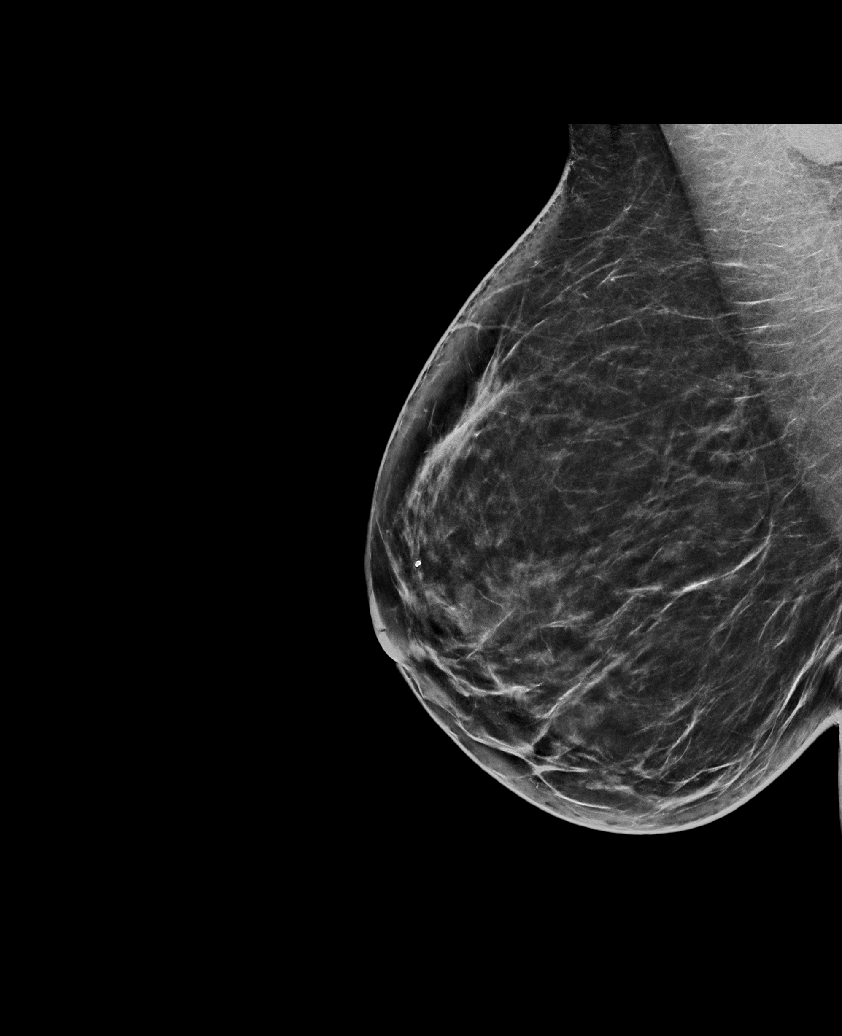

[L MLO tomo · tomo slice 38/75.0]
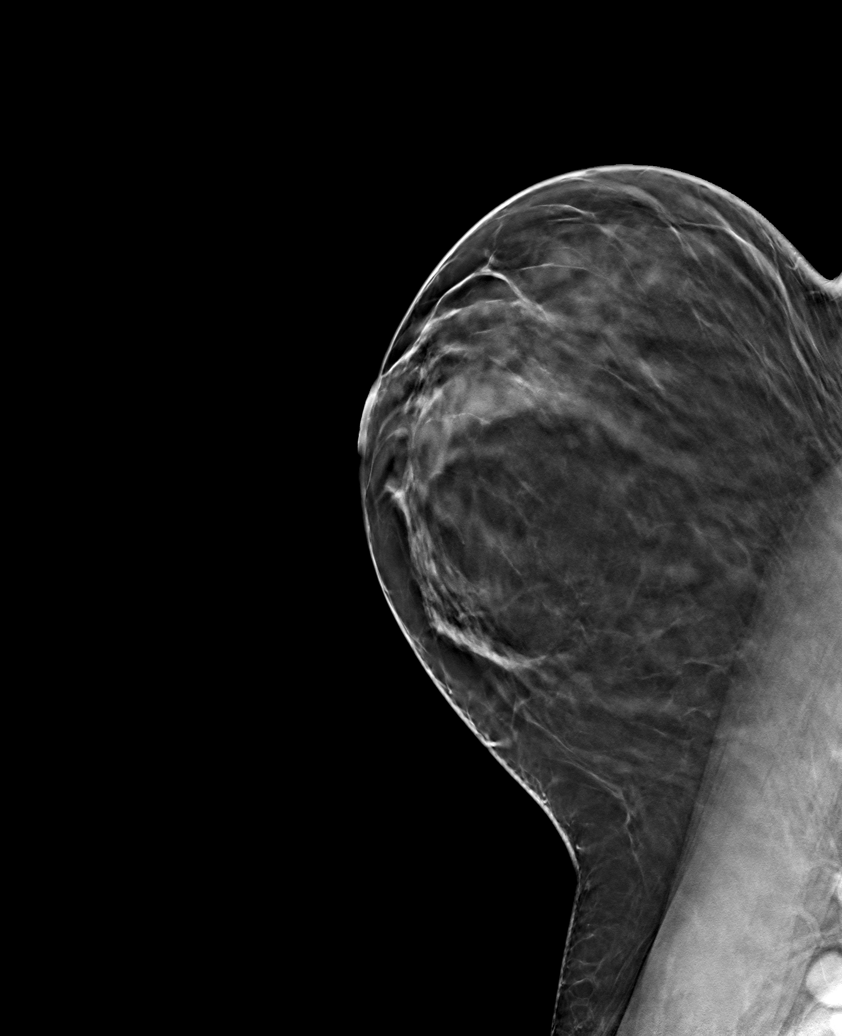

[6 of 30 positions shown; findings below may reference images not displayed]

ACR Breast Density Category c: The breast tissue is heterogeneously
dense, which may obscure small masses.
FINDINGS: The enlarged lymph node with the biopsy marking clip can be
partially visualized in the right axilla. Multiple surgical clips
are also seen in the right axilla. No suspicious calcifications,
masses or areas of distortion are seen in the bilateral breasts.

Mammographic images were processed with CAD.

Ultrasound of the right axilla demonstrates that the previously
biopsied lymph node demonstrates a fairly stable cortical thickness
of 1.1 cm, previously 1.0 cm in Thursday November, 2017. There are a couple
other prominent adjacent lymph nodes.
IMPRESSION: The biopsied benign right axillary lymph node appears fairly stable
in size. There are 2 other adjacent prominent lymph nodes, likely
related to the patient's autoimmune issues.

RECOMMENDATION:
The patient has not recently seen a rheumatologist (the provider she
saw previously at [facility] has left) or a hematologist for her
autoimmune issues/blood disorder. She is encouraged to seek out
these specialists as soon as possible. If either of these specialist
suggest repeat lymph node biopsy would be clinically useful, we will
see the patient back as soon as that is necessary. Otherwise a
three-month follow-up right axillary ultrasound is recommended.

I have discussed the findings and recommendations with the patient.
If applicable, a reminder letter will be sent to the patient
regarding the next appointment.

BI-RADS CATEGORY  3: Probably benign.

## 2022-04-20 ENCOUNTER — Other Ambulatory Visit: Payer: Self-pay | Admitting: Internal Medicine

## 2022-04-20 NOTE — Telephone Encounter (Signed)
Requested Prescriptions  Pending Prescriptions Disp Refills   levothyroxine (SYNTHROID) 25 MCG tablet [Pharmacy Med Name: LEVOTHYROXINE 25 MCG TABLET] 90 tablet 1    Sig: TAKE 1 TABLET BY MOUTH EVERY DAY BEFORE BREAKFAST     Endocrinology:  Hypothyroid Agents Passed - 04/20/2022  2:29 AM      Passed - TSH in normal range and within 360 days    TSH  Date Value Ref Range Status  03/01/2022 3.28 mIU/L Final    Comment:              Reference Range .           > or = 20 Years  0.40-4.50 .                Pregnancy Ranges           First trimester    0.26-2.66           Second trimester   0.55-2.73           Third trimester    0.43-2.91          Passed - Valid encounter within last 12 months    Recent Outpatient Visits           1 month ago Encounter for general adult medical examination with abnormal findings   [facility] [place], Coralie Keens, NP   4 months ago Immune neutropenia [facility])   [facility] [place], Coralie Keens, NP   1 year ago Encounter for general adult medical examination with abnormal findings   [facility] [place], Coralie Keens, NP   1 year ago Chronic cough   [facility] [place], Coralie Keens, [place]

## 2022-05-11 ENCOUNTER — Other Ambulatory Visit: Payer: Self-pay

## 2022-05-11 ENCOUNTER — Ambulatory Visit
Admission: RE | Admit: 2022-05-11 | Discharge: 2022-05-11 | Disposition: A | Discharge status: Home / Self Care | Payer: Commercial Managed Care - PPO | Source: Ambulatory Visit

## 2022-05-11 VITALS — BP 135/81 | HR 67 | Temp 98.9°F | Resp 20

## 2022-05-11 DIAGNOSIS — J069 Acute upper respiratory infection, unspecified: Secondary | ICD-10-CM

## 2022-05-11 DIAGNOSIS — J209 Acute bronchitis, unspecified: Secondary | ICD-10-CM

## 2022-05-11 MED ORDER — PREDNISONE 50 MG PO TABS: 50.0000 mg | ORAL_TABLET | Freq: Every day | ORAL | 0 refills | Status: AC

## 2022-05-11 MED ORDER — AMOXICILLIN-POT CLAVULANATE 875-125 MG PO TABS: 1.0000 | ORAL_TABLET | Freq: Two times a day (BID) | ORAL | 0 refills | Status: DC

## 2022-05-11 MED ORDER — PREDNISONE 50 MG PO TABS: 50.0000 mg | ORAL_TABLET | Freq: Every day | ORAL | 0 refills | Status: DC

## 2022-05-11 NOTE — Discharge Instructions (Signed)
Follow up here or with your primary care provider if your symptoms are worsening or not improving with treatment.     

## 2022-05-11 NOTE — ED Provider Notes (Signed)
Jenny Castillo    CSN: [id number] Arrival date & time: 05/11/22  1851      History   Chief Complaint Chief Complaint  Patient presents with   Cough    Bad cough, hear crackling when I breathe - Entered by patient    HPI Jenny Castillo is a 47 y.o. adult.    Cough   Patient presents to urgent care with concern for chest congestion and cough that started with URI symptoms about 2 weeks ago.  She states cough is generally dry sounding and "croupy".  She reports sore throat which is now resolved.    Past Medical History:  Diagnosis Date   Abnormal white blood cell (WBC)    low; white blood cell disorder   Ankylosing spondylitis (HCC)    Chronic fatigue    Galactorrhea    Immune neutropenia (HCC)    Thyroid nodule     Patient Active Problem List   Diagnosis Date Noted   Encounter for screening colonoscopy 04/03/2022   Class 1 obesity due to excess calories with body mass index (BMI) of 30.0 to 30.9 in adult 01/13/2021   Prediabetes 01/13/2021   Acquired hypothyroidism 01/04/2018   Ankylosing spondylitis 12/12/2016   Immune neutropenia (HCC) 10/19/2014    Past Surgical History:  Procedure Laterality Date   AXILLARY LYMPH NODE BIOPSY Right 2001   benign mass   BONE MARROW BIOPSY     BREAST BIOPSY Right 12/07/2017   BENIGN, REACTIVE APPEARING LYMPH NODE WITH REACTIVE    BREAST BIOPSY Right 04/06/2021   [place] bx neg reactive woth follicilar hyperplasia   CESAREAN SECTION  2004; 2010   COLONOSCOPY WITH PROPOFOL N/A 04/03/2022   Procedure: COLONOSCOPY WITH PROPOFOL;  Surgeon: Toney Reil, MD;  Location: [facility] ENDOSCOPY;  Service: Gastroenterology;  Laterality: N/A;   WISDOM TOOTH EXTRACTION      OB History     Gravida  4   Para  3   Term  3   Preterm      AB  1   Living  3      SAB  1   IAB      Ectopic      Multiple      Live Births  3            Home Medications    Prior to Admission medications   Medication Sig Start  Date End Date Taking? Authorizing Provider  albuterol (VENTOLIN HFA) 108 (90 Base) MCG/ACT inhaler TAKE 2 PUFFS BY MOUTH EVERY 6 HOURS AS NEEDED FOR WHEEZE OR SHORTNESS OF BREATH 03/01/22   Baity, Salvadore [place], NP  HUMIRA PEN 40 MG/0.4ML PNKT 40 mg every 14 (fourteen) days. 06/12/19   [provider]  ibuprofen (ADVIL,MOTRIN) 200 MG tablet Take 600 mg by mouth every morning.    [provider]  levonorgestrel (MIRENA) 20 MCG/24HR IUD 1 each by Intrauterine route once. Inserted 01/2016    [provider]  levothyroxine (SYNTHROID) 25 MCG tablet TAKE 1 TABLET BY MOUTH EVERY DAY BEFORE BREAKFAST 04/20/22   Lorre Munroe, NP  meloxicam (MOBIC) 15 MG tablet TAKE 1 TABLET BY MOUTH EVERY DAY 02/20/18   Lorre Munroe, NP    Family History Family History  Problem Relation Age of Onset   Cancer Mother 20       kidney-renal cell   Stroke Father    Hypertension Father    Heart disease Father    Healthy Sister  Healthy Sister    Asthma Maternal Grandmother    Hypertension Maternal Grandmother    Stroke Paternal Grandmother    Breast cancer Neg Hx     Social History Social History   Tobacco Use   Smoking status: Former    Types: Cigarettes    Quit date: 01/18/1997    Years since quitting: 25.3   Smokeless tobacco: Never  Vaping Use   Vaping Use: Never used  Substance Use Topics   Alcohol use: Yes    Alcohol/week: 0.0 standard drinks of alcohol   Drug use: No     Allergies   Bactrim [sulfamethoxazole-trimethoprim], Ceftin [cefuroxime axetil], Reglan [metoclopramide], Sulfa antibiotics, and Rocephin [ceftriaxone sodium in dextrose]   Review of Systems Review of Systems  Respiratory:  Positive for cough.      Physical Exam Triage Vital Signs ED Triage Vitals [05/11/22 1929]  Enc Vitals Group     BP      Pulse      Resp      Temp      Temp src      SpO2      Weight      Height      Head Circumference      Peak Flow      Pain Score 2     Pain  Loc      Pain Edu?      Excl. in GC?    No data found.  Updated Vital Signs There were no vitals taken for this visit.  Visual Acuity Right Eye Distance:   Left Eye Distance:   Bilateral Distance:    Right Eye Near:   Left Eye Near:    Bilateral Near:     Physical Exam Vitals reviewed.  Constitutional:      Appearance: Normal appearance.  Cardiovascular:     Rate and Rhythm: Normal rate and regular rhythm.     Pulses: Normal pulses.     Heart sounds: Normal heart sounds.  Pulmonary:     Effort: Pulmonary effort is normal.     Breath sounds: Normal breath sounds.  Skin:    General: Skin is warm and dry.  Neurological:     General: No focal deficit present.     Mental Status: She is alert and oriented to person, place, and time.  Psychiatric:        Mood and Affect: Mood normal.        Behavior: Behavior normal.      UC Treatments / Results  Labs (all labs ordered are listed, but only abnormal results are displayed) Labs Reviewed - No data to display  EKG   Radiology No results found.  Procedures Procedures (including critical care time)  Medications Ordered in UC Medications - No data to display  Initial Impression / Assessment and Plan / UC Course  I have reviewed the triage vital signs and the nursing notes.  Pertinent labs & imaging results that were available during my care of the patient were reviewed by me and considered in my medical decision making (see chart for details).   [name] is a 48 y.o. female presenting with URI symptoms with cough. Patient is afebrile without recent antipyretics, satting well on room air. Overall is ill appearing though non-toxic, well hydrated, without respiratory distress. Pulmonary exam is remarkable for severe dry sounding "barking" cough.  Lungs CTAB without wheezing, rhonchi, rales.  Suspect bronchitis secondary to past viral infection.  She continues to have some  upper respiratory symptoms so will treat with  antibacterial therapy given her symptoms x 2 weeks.  Prescribing Augmentin and a course of prednisone to relieve the "barking" cough.  Counseled patient on potential for adverse effects with medications prescribed/recommended today, ER and return-to-clinic precautions discussed, patient verbalized understanding and agreement with care plan.   Final Clinical Impressions(s) / UC Diagnoses   Final diagnoses:  None   Discharge Instructions   None    ED Prescriptions   None    PDMP not reviewed this encounter.   Charma Igo, [place] 05/11/22 1952

## 2022-05-11 NOTE — ED Triage Notes (Signed)
Patient has heard crackling noises in upper chest, cough.  Tongue is sore. Initially had a sore throat, but not anymore  Has taken advil, mucinex, inhaler, and has gargled.

## 2022-08-24 ENCOUNTER — Ambulatory Visit
Admission: RE | Admit: 2022-08-24 | Discharge: 2022-08-24 | Disposition: A | Discharge status: Home / Self Care | Payer: Commercial Managed Care - PPO | Source: Ambulatory Visit | Attending: Otolaryngology | Admitting: Otolaryngology

## 2022-08-24 DIAGNOSIS — H93A1 Pulsatile tinnitus, right ear: Secondary | ICD-10-CM

## 2022-08-24 MED ORDER — GADOPICLENOL 0.5 MMOL/ML IV SOLN
8.0000 mL | Freq: Once | INTRAVENOUS | Status: AC | PRN
  Administered 2022-08-24: 8 mL via INTRAVENOUS

## 2022-10-15 ENCOUNTER — Other Ambulatory Visit: Payer: Self-pay | Admitting: Internal Medicine

## 2022-10-17 NOTE — Telephone Encounter (Signed)
Requested Prescriptions  Pending Prescriptions Disp Refills   levothyroxine (SYNTHROID) 25 MCG tablet [Pharmacy Med Name: LEVOTHYROXINE 25 MCG TABLET] 90 tablet 1    Sig: TAKE 1 TABLET BY MOUTH EVERY DAY BEFORE BREAKFAST     Endocrinology:  Hypothyroid Agents Passed - 10/15/2022  9:18 AM      Passed - TSH in normal range and within 360 days    TSH  Date Value Ref Range Status  03/01/2022 3.28 mIU/L Final    Comment:              Reference Range .           > or = 20 Years  0.40-4.50 .                Pregnancy Ranges           First trimester    0.26-2.66           Second trimester   0.55-2.73           Third trimester    0.43-2.91          Passed - Valid encounter within last 12 months    Recent Outpatient Visits           7 months ago Encounter for general adult medical examination with abnormal findings   [facility] [place], Salvadore Oxford, NP   10 months ago Immune neutropenia [facility])   [place] [facility] [place], Salvadore Oxford, NP   1 year ago Encounter for general adult medical examination with abnormal findings   [facility] [place], Salvadore Oxford, NP   2 years ago Chronic cough   [place] [facility] [place], Salvadore Oxford, [place]

## 2022-12-15 ENCOUNTER — Ambulatory Visit: Payer: Commercial Managed Care - PPO | Admitting: Internal Medicine

## 2022-12-15 ENCOUNTER — Encounter: Payer: Self-pay | Admitting: Internal Medicine

## 2022-12-15 VITALS — BP 118/78 | Ht 64.0 in | Wt 175.0 lb

## 2022-12-15 DIAGNOSIS — Z136 Encounter for screening for cardiovascular disorders: Secondary | ICD-10-CM

## 2022-12-15 DIAGNOSIS — Z6830 Body mass index (BMI) 30.0-30.9, adult: Secondary | ICD-10-CM

## 2022-12-15 DIAGNOSIS — R0781 Pleurodynia: Secondary | ICD-10-CM

## 2022-12-15 DIAGNOSIS — R7303 Prediabetes: Secondary | ICD-10-CM

## 2022-12-15 DIAGNOSIS — M459 Ankylosing spondylitis of unspecified sites in spine: Secondary | ICD-10-CM

## 2022-12-15 DIAGNOSIS — E039 Hypothyroidism, unspecified: Secondary | ICD-10-CM | POA: Diagnosis not present

## 2022-12-15 DIAGNOSIS — G932 Benign intracranial hypertension: Secondary | ICD-10-CM

## 2022-12-15 DIAGNOSIS — D709 Neutropenia, unspecified: Secondary | ICD-10-CM

## 2022-12-15 DIAGNOSIS — E66811 Obesity, class 1: Secondary | ICD-10-CM

## 2022-12-15 DIAGNOSIS — E6609 Other obesity due to excess calories: Secondary | ICD-10-CM

## 2022-12-15 NOTE — Progress Notes (Unsigned)
Subjective:    Patient ID: Jenny Castillo, adult    DOB: [birth year], 47 y.o.   MRN: [id number]  HPI  Patient presents the clinic today for follow-up chronic conditions.  Hypothyroidism: She denies any issues on her current dose of levothyroxine.  She does not follow with endocrinology.  Immune neutropenia: Her last WBC count was 3, 02/2022.  She is following with hematology.  Ankylosing spondylitis: Managed with meloxicam but is no longer taking humira/hyrimoz.  She follows with rheumatology.  Prediabetes: Her last A1C was 5.9%, 02/2022. She is not taking any oral diabetic medication at this time. She does not check her sugars.  ICH: She has been having headaches. She is taking topirimate as prescribed. She follows with neurology.   Review of Systems     Past Medical History:  Diagnosis Date   Abnormal white blood cell (WBC)    low; white blood cell disorder   Ankylosing spondylitis (HCC)    Chronic fatigue    Galactorrhea    Immune neutropenia (HCC)    Thyroid nodule     Current Outpatient Medications  Medication Sig Dispense Refill   albuterol (VENTOLIN HFA) 108 (90 Base) MCG/ACT inhaler TAKE 2 PUFFS BY MOUTH EVERY 6 HOURS AS NEEDED FOR WHEEZE OR SHORTNESS OF BREATH 8.5 each 2   amoxicillin-clavulanate (AUGMENTIN) 875-125 MG tablet Take 1 tablet by mouth every 12 (twelve) hours. 14 tablet 0   HUMIRA PEN 40 MG/0.4ML PNKT 40 mg every 14 (fourteen) days. (Patient not taking: Reported on 05/11/2022)     HYRIMOZ 40 MG/0.4ML SOAJ Inject into the skin.     ibuprofen (ADVIL,MOTRIN) 200 MG tablet Take 600 mg by mouth every morning.     levonorgestrel (MIRENA) 20 MCG/24HR IUD 1 each by Intrauterine route once. Inserted 01/2016     levothyroxine (SYNTHROID) 25 MCG tablet TAKE 1 TABLET BY MOUTH EVERY DAY BEFORE BREAKFAST 90 tablet 1   meloxicam (MOBIC) 15 MG tablet TAKE 1 TABLET BY MOUTH EVERY DAY 30 tablet 2   No current facility-administered medications for this visit.    Allergies   Allergen Reactions   Bactrim [Sulfamethoxazole-Trimethoprim] Other (See Comments)    Has a low blood count has to stay away from certain medications    Ceftin [Cefuroxime Axetil] Other (See Comments)   Reglan [Metoclopramide] Other (See Comments)    Heavy pressure in chest , can't breathe   Sulfa Antibiotics Other (See Comments)   Rocephin [Ceftriaxone Sodium In Dextrose] Rash    Swelling     Family History  Problem Relation Age of Onset   Cancer Mother 66       kidney-renal cell   Stroke Father    Hypertension Father    Heart disease Father    Healthy Sister    Healthy Sister    Asthma Maternal Grandmother    Hypertension Maternal Grandmother    Stroke Paternal Grandmother    Breast cancer Neg Hx     Social History   Socioeconomic History   Marital status: Married    Spouse name: Not on file   Number of children: 3   Years of education: 14   Highest education level: Not on file  Occupational History   Occupation: insurance  Tobacco Use   Smoking status: Former    Current packs/day: 0.00    Types: Cigarettes    Quit date: 01/18/1997    Years since quitting: 25.9   Smokeless tobacco: Never  Vaping Use   Vaping status: Never Used  Substance and Sexual Activity   Alcohol use: Yes    Alcohol/week: 0.0 standard drinks of alcohol   Drug use: No   Sexual activity: Yes  Other Topics Concern   Not on file  Social History Narrative   Not on file   Social Determinants of Health   Financial Resource Strain: Not on file  Food Insecurity: Not on file  Transportation Needs: Not on file  Physical Activity: Insufficiently Active (12/20/2016)   Exercise Vital Sign    Days of Exercise per Week: 2 days    Minutes of Exercise per Session: 30 min  Stress: No Stress Concern Present (12/20/2016)   [employer] of Occupational Health - Occupational Stress Questionnaire    Feeling of Stress : Not at all  Social Connections: Socially Integrated (12/20/2016)   Social  Connection and Isolation Panel [NHANES]    Frequency of Communication with Friends and Family: More than three times a week    Frequency of Social Gatherings with Friends and Family: Twice a week    Attends Religious Services: More than 4 times per year    Active Member of [employer] or Organizations: Yes    Attends Banker Meetings: 1 to 4 times per year    Marital Status: Married  Catering manager Violence: Not At Risk (12/20/2016)   Humiliation, Afraid, Rape, and Kick questionnaire    Fear of Current or Ex-Partner: No    Emotionally Abused: No    Physically Abused: No    Sexually Abused: No     Constitutional: Pt reports intermittent headaches. Denies fever, malaise, fatigue, or abrupt weight changes.  HEENT: Denies eye pain, eye redness, ear pain, ringing in the ears, wax buildup, runny nose, nasal congestion, bloody nose, or sore throat. Respiratory: Denies difficulty breathing, shortness of breath, cough or sputum production.   Cardiovascular: Denies chest pain, chest tightness, palpitations or swelling in the hands or feet.  Gastrointestinal: Denies abdominal pain, bloating, constipation, diarrhea or blood in the stool.  GU: Denies urgency, frequency, pain with urination, burning sensation, blood in urine, odor or discharge. Musculoskeletal: Pt reports joint pain. Denies decrease in range of motion, difficulty with gait, muscle pain or joint swelling.  Skin: Denies redness, rashes, lesions or ulcercations.  Neurological: Denies dizziness, difficulty with memory, difficulty with speech or problems with balance and coordination.  Psych: Denies anxiety, depression, SI/HI.  No other specific complaints in a complete review of systems (except as listed in HPI above).  Objective:   Physical Exam   There were no vitals taken for this visit.  Wt Readings from Last 3 Encounters:  04/03/22 176 lb (79.8 kg)  03/01/22 176 lb (79.8 kg)  12/01/21 172 lb (78 kg)    General:  Appears her stated age, overweight, in NAD. Skin: Warm, dry and intact.  HEENT: Head: normal shape and size; Eyes: sclera white and EOMs intact;  Neck:  Neck supple, trachea midline. No masses, lumps or thyromegaly present.  Cardiovascular: Normal rate and rhythm. S1,S2 noted.  No murmur, rubs or gallops noted. No JVD or BLE edema.  Pulmonary/Chest: Normal effort and positive vesicular breath sounds. No respiratory distress. No wheezes, rales or ronchi noted.  Abdomen: Soft and nontender. Normal bowel sounds. No distention or masses noted. Liver, spleen and kidneys non palpable. Musculoskeletal: Strength 5/5 BUE/BLE. No difficulty with gait.  Neurological: Alert and oriented. Cranial nerves II-XII grossly intact. Coordination normal.  Psychiatric: Mood and affect normal. Behavior is normal. Judgment and thought content normal.  BMET    Component Value Date/Time   NA 141 03/01/2022 0913   K 4.3 03/01/2022 0913   CL 103 03/01/2022 0913   CO2 27 03/01/2022 0913   GLUCOSE 88 03/01/2022 0913   BUN 11 03/01/2022 0913   CREATININE 0.80 03/01/2022 0913   CALCIUM 9.6 03/01/2022 0913   GFRNONAA >60 08/23/2020 1112    Lipid Panel     Component Value Date/Time   CHOL 158 03/01/2022 0913   TRIG 135 03/01/2022 0913   HDL 49 (L) 03/01/2022 0913   CHOLHDL 3.2 03/01/2022 0913   VLDL 14.4 12/08/2019 1519   LDLCALC 86 03/01/2022 0913    CBC    Component Value Date/Time   WBC 3.0 (L) 03/01/2022 0913   RBC 4.53 03/01/2022 0913   HGB 14.2 03/01/2022 0913   HCT 41.5 03/01/2022 0913   PLT 207 03/01/2022 0913   MCV 91.6 03/01/2022 0913   MCH 31.3 03/01/2022 0913   MCHC 34.2 03/01/2022 0913   RDW 11.8 03/01/2022 0913   LYMPHSABS 2,024 12/01/2021 1539   MONOABS 0.5 01/19/2020 1141   EOSABS 82 12/01/2021 1539   BASOSABS 39 12/01/2021 1539    Hgb A1C Lab Results  Component Value Date   HGBA1C 5.9 (H) 03/01/2022           Assessment & Plan:    RTC in 4 months for your annual  exam Nicki Reaper, NP

## 2022-12-16 LAB — CBC WITH DIFFERENTIAL/PLATELET
Absolute Lymphocytes: 1525 {cells}/uL (ref 850–3900)
Absolute Monocytes: 566 {cells}/uL (ref 200–950)
Basophils Absolute: 31 {cells}/uL (ref 0–200)
Basophils Relative: 0.8 %
Eosinophils Absolute: 98 {cells}/uL (ref 15–500)
Eosinophils Relative: 2.5 %
HCT: 40.2 % (ref 35.0–45.0)
Hemoglobin: 13.3 g/dL (ref 11.7–15.5)
MCH: 30.3 pg (ref 27.0–33.0)
MCHC: 33.1 g/dL (ref 32.0–36.0)
MCV: 91.6 fL (ref 80.0–100.0)
MPV: 11.1 fL (ref 7.5–12.5)
Monocytes Relative: 14.5 %
Neutro Abs: 1681 {cells}/uL (ref 1500–7800)
Neutrophils Relative %: 43.1 %
Platelets: 221 10*3/uL (ref 140–400)
RBC: 4.39 10*6/uL (ref 3.80–5.10)
RDW: 12 % (ref 11.0–15.0)
Total Lymphocyte: 39.1 %
WBC: 3.9 10*3/uL (ref 3.8–10.8)

## 2022-12-16 LAB — COMPLETE METABOLIC PANEL WITH GFR
AG Ratio: 1.5 (calc) (ref 1.0–2.5)
ALT: 16 U/L (ref 6–29)
AST: 17 U/L (ref 10–35)
Albumin: 4.7 g/dL (ref 3.6–5.1)
Alkaline phosphatase (APISO): 82 U/L (ref 31–125)
BUN: 16 mg/dL (ref 7–25)
CO2: 25 mmol/L (ref 20–32)
Calcium: 9.2 mg/dL (ref 8.6–10.2)
Chloride: 103 mmol/L (ref 98–110)
Creat: 0.81 mg/dL (ref 0.50–0.99)
Globulin: 3.1 g/dL (ref 1.9–3.7)
Glucose, Bld: 102 mg/dL (ref 65–139)
Potassium: 3.7 mmol/L (ref 3.5–5.3)
Sodium: 138 mmol/L (ref 135–146)
Total Bilirubin: 0.3 mg/dL (ref 0.2–1.2)
Total Protein: 7.8 g/dL (ref 6.1–8.1)
eGFR: 89 mL/min/{1.73_m2} (ref >=60)

## 2022-12-16 LAB — LIPID PANEL
Cholesterol: 154 mg/dL (ref <200)
HDL: 44 mg/dL — ABNORMAL LOW (ref >=50)
LDL Cholesterol (Calc): 86 mg/dL
Non-HDL Cholesterol (Calc): 110 mg/dL (ref <130)
Total CHOL/HDL Ratio: 3.5 (calc) (ref <5.0)
Triglycerides: 140 mg/dL (ref <150)

## 2022-12-16 LAB — HEMOGLOBIN A1C
Hgb A1c MFr Bld: 5.7 %{Hb} — ABNORMAL HIGH (ref <5.7)
Mean Plasma Glucose: 117 mg/dL
eAG (mmol/L): 6.5 mmol/L

## 2022-12-16 LAB — T4, FREE: Free T4: 1 ng/dL (ref 0.8–1.8)

## 2022-12-16 LAB — TSH: TSH: 2.73 m[IU]/L

## 2022-12-17 DIAGNOSIS — G932 Benign intracranial hypertension: Secondary | ICD-10-CM | POA: Insufficient documentation

## 2022-12-17 NOTE — Assessment & Plan Note (Signed)
CBC with differential today She will continue to follow with hematology

## 2022-12-17 NOTE — Assessment & Plan Note (Signed)
Continue topomax per rheumatology

## 2022-12-17 NOTE — Assessment & Plan Note (Signed)
TSH and free T4 today Will adjust Levothyroxine if needed based on labs

## 2022-12-17 NOTE — Assessment & Plan Note (Signed)
Managed with Meloxicam  She will continue to follow with rheumatology

## 2022-12-17 NOTE — Assessment & Plan Note (Signed)
Encourage diet and exercise for weight loss 

## 2022-12-17 NOTE — Assessment & Plan Note (Signed)
A1c today Encouraged low carb diet

## 2022-12-17 NOTE — Patient Instructions (Signed)
Prediabetes Eating Plan Prediabetes is a condition that causes blood sugar (glucose) levels to be higher than normal. This increases the risk for developing type 2 diabetes (type 2 diabetes mellitus). Working with a health care provider or nutrition specialist (dietitian) to make diet and lifestyle changes can help prevent the onset of diabetes. These changes may help you: Control your blood glucose levels. Improve your cholesterol levels. Manage your blood pressure. What are tips for following this plan? Reading food labels Read food labels to check the amount of fat, salt (sodium), and sugar in prepackaged foods. Avoid foods that have: Saturated fats. Trans fats. Added sugars. Avoid foods that have more than 300 milligrams (mg) of sodium per serving. Limit your sodium intake to less than 2,300 mg each day. Shopping Avoid buying pre-made and processed foods. Avoid buying drinks with added sugar. Cooking Cook with olive oil. Do not use butter, lard, or ghee. Bake, broil, grill, steam, or boil foods. Avoid frying. Meal planning  Work with your dietitian to create an eating plan that is right for you. This may include tracking how many calories you take in each day. Use a food diary, notebook, or mobile application to track what you eat at each meal. Consider following a Mediterranean diet. This includes: Eating several servings of fresh fruits and vegetables each day. Eating fish at least twice a week. Eating one serving each day of whole grains, beans, nuts, and seeds. Using olive oil instead of other fats. Limiting alcohol. Limiting red meat. Using nonfat or low-fat dairy products. Consider following a plant-based diet. This includes dietary choices that focus on eating mostly vegetables and fruit, grains, beans, nuts, and seeds. If you have high blood pressure, you may need to limit your sodium intake or follow a diet such as the DASH (Dietary Approaches to Stop Hypertension) eating  plan. The DASH diet aims to lower high blood pressure. Lifestyle Set weight loss goals with help from your health care team. It is recommended that most people with prediabetes lose 7% of their body weight. Exercise for at least 30 minutes 5 or more days a week. Attend a support group or seek support from a mental health counselor. Take over-the-counter and prescription medicines only as told by your health care provider. What foods are recommended? Fruits Berries. Bananas. Apples. Oranges. Grapes. Papaya. Mango. Pomegranate. Kiwi. Grapefruit. Cherries. Vegetables Lettuce. Spinach. Peas. Beets. Cauliflower. Cabbage. Broccoli. Carrots. Tomatoes. Squash. Eggplant. Herbs. Peppers. Onions. Cucumbers. Brussels sprouts. Grains Whole grains, such as whole-wheat or whole-grain breads, crackers, cereals, and pasta. Unsweetened oatmeal. Bulgur. Barley. Quinoa. Brown rice. Corn or whole-wheat flour tortillas or taco shells. Meats and other proteins Seafood. Poultry without skin. Lean cuts of pork and beef. Tofu. Eggs. Nuts. Beans. Dairy Low-fat or fat-free dairy products, such as yogurt, cottage cheese, and cheese. Beverages Water. Tea. Coffee. Sugar-free or diet soda. Seltzer water. Low-fat or nonfat milk. Milk alternatives, such as soy or almond milk. Fats and oils Olive oil. Canola oil. Sunflower oil. Grapeseed oil. Avocado. Walnuts. Sweets and desserts Sugar-free or low-fat pudding. Sugar-free or low-fat ice cream and other frozen treats. Seasonings and condiments Herbs. Sodium-free spices. Mustard. Relish. Low-salt, low-sugar ketchup. Low-salt, low-sugar barbecue sauce. Low-fat or fat-free mayonnaise. The items listed above may not be a complete list of recommended foods and beverages. Contact a dietitian for more information. What foods are not recommended? Fruits Fruits canned with syrup. Vegetables Canned vegetables. Frozen vegetables with butter or cream sauce. Grains Refined white  flour and flour   products, such as bread, pasta, snack foods, and cereals. Meats and other proteins Fatty cuts of meat. Poultry with skin. Breaded or fried meat. Processed meats. Dairy Full-fat yogurt, cheese, or milk. Beverages Sweetened drinks, such as iced tea and soda. Fats and oils Butter. Lard. Ghee. Sweets and desserts Baked goods, such as cake, cupcakes, pastries, cookies, and cheesecake. Seasonings and condiments Spice mixes with added salt. Ketchup. Barbecue sauce. Mayonnaise. The items listed above may not be a complete list of foods and beverages that are not recommended. Contact a dietitian for more information. Where to find more information American Diabetes Association: www.diabetes.org Summary You may need to make diet and lifestyle changes to help prevent the onset of diabetes. These changes can help you control blood sugar, improve cholesterol levels, and manage blood pressure. Set weight loss goals with help from your health care team. It is recommended that most people with prediabetes lose 7% of their body weight. Consider following a Mediterranean diet. This includes eating plenty of fresh fruits and vegetables, whole grains, beans, nuts, seeds, fish, and low-fat dairy, and using olive oil instead of other fats. This information is not intended to replace advice given to you by your health care provider. Make sure you discuss any questions you have with your health care provider. Document Revised: 04/10/2019 Document Reviewed: 04/10/2019 Elsevier Patient Education  2024 Elsevier Inc.  

## 2022-12-25 ENCOUNTER — Ambulatory Visit
Admission: RE | Admit: 2022-12-25 | Discharge: 2022-12-25 | Disposition: A | Discharge status: Home / Self Care | Payer: Commercial Managed Care - PPO | Attending: Internal Medicine | Admitting: Internal Medicine

## 2022-12-25 ENCOUNTER — Ambulatory Visit
Admission: RE | Admit: 2022-12-25 | Discharge: 2022-12-25 | Disposition: A | Discharge status: Home / Self Care | Payer: Commercial Managed Care - PPO | Source: Ambulatory Visit | Attending: Internal Medicine | Admitting: Internal Medicine

## 2022-12-25 DIAGNOSIS — R0781 Pleurodynia: Secondary | ICD-10-CM | POA: Diagnosis present

## 2023-01-14 ENCOUNTER — Other Ambulatory Visit: Payer: Self-pay | Admitting: Internal Medicine

## 2023-01-16 NOTE — Telephone Encounter (Signed)
Requested Prescriptions  Pending Prescriptions Disp Refills   levothyroxine (SYNTHROID) 25 MCG tablet [Pharmacy Med Name: LEVOTHYROXINE 25 MCG TABLET] 90 tablet 1    Sig: TAKE 1 TABLET BY MOUTH EVERY DAY BEFORE BREAKFAST     Endocrinology:  Hypothyroid Agents Passed - 01/16/2023 10:08 AM      Passed - TSH in normal range and within 360 days    TSH  Date Value Ref Range Status  12/15/2022 2.73 mIU/L Final    Comment:              Reference Range .           > or = 20 Years  0.40-4.50 .                Pregnancy Ranges           First trimester    0.26-2.66           Second trimester   0.55-2.73           Third trimester    0.43-2.91          Passed - Valid encounter within last 12 months    Recent Outpatient Visits           1 month ago Prediabetes   [place] [facility] [place], Salvadore Oxford, NP   10 months ago Encounter for general adult medical examination with abnormal findings   [place] [facility] [place], Salvadore Oxford, NP   1 year ago Immune neutropenia [facility])   [place] [facility] [place], Salvadore Oxford, NP   2 years ago Encounter for general adult medical examination with abnormal findings   [place] [facility] [place], Salvadore Oxford, NP   2 years ago Chronic cough   [place] [facility] [place], Salvadore Oxford, NP       Future Appointments             In 5 months Baity, Salvadore Oxford, NP [place] [facility], [facility]

## 2023-03-05 ENCOUNTER — Other Ambulatory Visit: Payer: Self-pay | Admitting: Internal Medicine

## 2023-03-05 DIAGNOSIS — Z1231 Encounter for screening mammogram for malignant neoplasm of breast: Secondary | ICD-10-CM

## 2023-03-07 ENCOUNTER — Ambulatory Visit: Payer: Self-pay

## 2023-03-07 ENCOUNTER — Encounter: Payer: Self-pay | Admitting: Internal Medicine

## 2023-03-07 ENCOUNTER — Ambulatory Visit: Payer: Commercial Managed Care - PPO | Admitting: Internal Medicine

## 2023-03-07 ENCOUNTER — Ambulatory Visit
Admission: RE | Admit: 2023-03-07 | Discharge: 2023-03-07 | Disposition: A | Discharge status: Home / Self Care | Payer: Commercial Managed Care - PPO | Source: Ambulatory Visit | Attending: Internal Medicine | Admitting: Internal Medicine

## 2023-03-07 ENCOUNTER — Ambulatory Visit
Admission: RE | Admit: 2023-03-07 | Discharge: 2023-03-07 | Disposition: A | Discharge status: Home / Self Care | Payer: Commercial Managed Care - PPO | Source: Home / Self Care | Attending: Internal Medicine | Admitting: Internal Medicine

## 2023-03-07 VITALS — BP 122/78 | HR 73 | Ht 64.0 in | Wt 175.4 lb

## 2023-03-07 DIAGNOSIS — M549 Dorsalgia, unspecified: Secondary | ICD-10-CM | POA: Diagnosis not present

## 2023-03-07 DIAGNOSIS — R1012 Left upper quadrant pain: Secondary | ICD-10-CM

## 2023-03-07 NOTE — Progress Notes (Signed)
Subjective:    Patient ID: Jenny Castillo, adult    DOB: 1975-07-29, 48 y.o.   MRN: [id number]  HPI  Discussed the use of AI scribe software for clinical note transcription with the patient, who gave verbal consent to proceed.  Jenny Castillo is a 48 year old female who presents with chest and back pain.  She has been experiencing chest and back pain for the past week. The pain is located in the left upper quadrant, underneath the breast area, and radiates to her back, creating a sensation of pain that 'goes straight through.' The pain is intermittent and not exacerbated by physical activity such as walking or climbing stairs.  The pain worsens when lying on her left side or back, making it 'really, really painful' and causing shortness of breath when she rolls over in bed. She describes the pain as a 'gnawing like constant' sensation that sometimes feels like a 'contraction' or 'crampy' and 'squeezing.'  She has tried various over-the-counter medications including Pepto tablets, omeprazole, Tylenol, and Advil, but none have provided relief. She reports a lack of appetite, stating that eating does not worsen the pain, but she feels uncomfortable and does not want to eat much.  She denies a history of diverticulosis, with a normal colonoscopy in March 2024. She consumes alcohol 'extremely rarely.'  No chest tightness, shortness of breath, upper respiratory symptoms, cough, vomiting, constipation, diarrhea, blood in stool, urinary or vaginal symptoms, or rash. She reports nausea and dizziness yesterday but no fever.       Review of Systems     Past Medical History:  Diagnosis Date   Abnormal white blood cell (WBC)    low; white blood cell disorder   Ankylosing spondylitis (HCC)    Chronic fatigue    Galactorrhea    Immune neutropenia (HCC)    Intracranial hypertension    Thyroid nodule     Current Outpatient Medications  Medication Sig Dispense Refill   ibuprofen (ADVIL,MOTRIN) 200 MG  tablet Take 600 mg by mouth every morning.     levonorgestrel (MIRENA) 20 MCG/24HR IUD 1 each by Intrauterine route once. Inserted 01/2016     levothyroxine (SYNTHROID) 25 MCG tablet TAKE 1 TABLET BY MOUTH EVERY DAY BEFORE BREAKFAST 90 tablet 1   meloxicam (MOBIC) 15 MG tablet TAKE 1 TABLET BY MOUTH EVERY DAY (Patient not taking: Reported on 12/15/2022) 30 tablet 2   topiramate (TOPAMAX) 25 MG tablet Take 25 mg by mouth 2 (two) times daily.     No current facility-administered medications for this visit.    Allergies  Allergen Reactions   Bactrim [Sulfamethoxazole-Trimethoprim] Other (See Comments)    Has a low blood count has to stay away from certain medications    Ceftin [Cefuroxime Axetil] Other (See Comments)   Reglan [Metoclopramide] Other (See Comments)    Heavy pressure in chest , can't breathe   Sulfa Antibiotics Other (See Comments)   Rocephin [Ceftriaxone Sodium In Dextrose] Rash    Swelling     Family History  Problem Relation Age of Onset   Cancer Mother 82       kidney-renal cell   Stroke Father    Hypertension Father    Heart disease Father    Healthy Sister    Healthy Sister    Asthma Maternal Grandmother    Hypertension Maternal Grandmother    Stroke Paternal Grandmother    Breast cancer Neg Hx     Social History   Socioeconomic History   Marital  status: Married    Spouse name: Not on file   Number of children: 3   Years of education: 14   Highest education level: Not on file  Occupational History   Occupation: insurance  Tobacco Use   Smoking status: Former    Current packs/day: 0.00    Types: Cigarettes    Quit date: 01/18/1997    Years since quitting: 26.1   Smokeless tobacco: Never  Vaping Use   Vaping status: Never Used  Substance and Sexual Activity   Alcohol use: Yes    Alcohol/week: 0.0 standard drinks of alcohol   Drug use: No   Sexual activity: Yes  Other Topics Concern   Not on file  Social History Narrative   Not on file    Social Drivers of Health   Financial Resource Strain: Patient Declined (12/15/2022)   Overall Financial Resource Strain (CARDIA)    Difficulty of Paying Living Expenses: Patient declined  Food Insecurity: No Food Insecurity (12/15/2022)   Hunger Vital Sign    Worried About Running Out of Food in the Last Year: Never true    Ran Out of Food in the Last Year: Never true  Transportation Needs: No Transportation Needs (12/15/2022)   PRAPARE - Administrator, Civil Service (Medical): No    Lack of Transportation (Non-Medical): No  Physical Activity: Patient Declined (12/15/2022)   Exercise Vital Sign    Days of Exercise per Week: Patient declined    Minutes of Exercise per Session: Patient declined  Stress: Patient Declined (12/15/2022)   [employer] of Occupational Health - Occupational Stress Questionnaire    Feeling of Stress : Patient declined  Social Connections: Unknown (12/15/2022)   Social Connection and Isolation Panel [NHANES]    Frequency of Communication with Friends and Family: More than three times a week    Frequency of Social Gatherings with Friends and Family: Not on file    Attends Religious Services: Not on file    Active Member of Clubs or Organizations: Not on file    Attends Banker Meetings: Not on file    Marital Status: Not on file  Intimate Partner Violence: Not At Risk (12/15/2022)   Humiliation, Afraid, Rape, and Kick questionnaire    Fear of Current or Ex-Partner: No    Emotionally Abused: No    Physically Abused: No    Sexually Abused: No     Constitutional: Pt reports intermittent headaches. Denies fever, malaise, fatigue, or abrupt weight changes.  HEENT: Denies eye pain, eye redness, ear pain, ringing in the ears, wax buildup, runny nose, nasal congestion, bloody nose, or sore throat. Respiratory: Pt reports intermittent shortness of breath. Denies difficulty breathing, cough or sputum production.   Cardiovascular:  Denies chest pain, chest tightness, palpitations or swelling in the hands or feet.  Gastrointestinal: Pt reports nausea, poor appretite. Denies abdominal pain, bloating, constipation, diarrhea or blood in the stool.  GU: Denies urgency, frequency, pain with urination, burning sensation, blood in urine, odor or discharge. Musculoskeletal: Pt reports joint pain. Denies decrease in range of motion, difficulty with gait, muscle pain or joint swelling.  Skin: Denies redness, rashes, lesions or ulcercations.  Neurological: Pt reports dizziness. Denies difficulty with memory, difficulty with speech or problems with balance and coordination.  Psych: Denies anxiety, depression, SI/HI.  No other specific complaints in a complete review of systems (except as listed in HPI above).  Objective:   Physical Exam BP 122/78 (BP Location: Left Arm, Patient Position:  Sitting, Cuff Size: Normal)   Pulse 73   Ht 5\' 4"  (1.626 m)   Wt 175 lb 6.4 oz (79.6 kg)   SpO2 99%   BMI 30.11 kg/m    Wt Readings from Last 3 Encounters:  12/15/22 175 lb (79.4 kg)  04/03/22 176 lb (79.8 kg)  03/01/22 176 lb (79.8 kg)    General: Appears her stated age, obese in NAD. Skin: Warm, dry and intact.  HEENT: Head: normal shape and size; Eyes: sclera white and EOMs intact;  Neck:  Neck supple, trachea midline. No masses, lumps or thyromegaly present.  Cardiovascular: Normal rate and rhythm. S1,S2 noted.  No murmur, rubs or gallops noted. Pulmonary/Chest: Normal effort and positive vesicular breath sounds. No respiratory distress. No wheezes, rales or ronchi noted.  Abdomen: Soft, mildly tender in the epigastric region, LUQ. No distention or masses noted. No CVA tenderness noted. Musculoskeletal: No pain with palpation of the left upper chest wall, thoracic spine or left paraspinal muscles.  No difficulty with gait.  Neurological: Alert and oriented. Coordination normal.    BMET    Component Value Date/Time   NA 138  12/15/2022 1549   K 3.7 12/15/2022 1549   CL 103 12/15/2022 1549   CO2 25 12/15/2022 1549   GLUCOSE 102 12/15/2022 1549   BUN 16 12/15/2022 1549   CREATININE 0.81 12/15/2022 1549   CALCIUM 9.2 12/15/2022 1549   GFRNONAA >60 08/23/2020 1112    Lipid Panel     Component Value Date/Time   CHOL 154 12/15/2022 1549   TRIG 140 12/15/2022 1549   HDL 44 (L) 12/15/2022 1549   CHOLHDL 3.5 12/15/2022 1549   VLDL 14.4 12/08/2019 1519   LDLCALC 86 12/15/2022 1549    CBC    Component Value Date/Time   WBC 3.9 12/15/2022 1549   RBC 4.39 12/15/2022 1549   HGB 13.3 12/15/2022 1549   HCT 40.2 12/15/2022 1549   PLT 221 12/15/2022 1549   MCV 91.6 12/15/2022 1549   MCH 30.3 12/15/2022 1549   MCHC 33.1 12/15/2022 1549   RDW 12.0 12/15/2022 1549   LYMPHSABS 2,024 12/01/2021 1539   MONOABS 0.5 01/19/2020 1141   EOSABS 98 12/15/2022 1549   BASOSABS 31 12/15/2022 1549    Hgb A1C Lab Results  Component Value Date   HGBA1C 5.7 (H) 12/15/2022           Assessment & Plan:  Assessment and Plan    LUQ Abdominal Pain and Back Pain Pain in the left upper quadrant and back, intermittent, worsens with lying on the left side and deep breathing. No relief with Pepto, Omeprazole, Tylenol, or Advil. No associated respiratory, gastrointestinal, urinary, or vaginal symptoms. Physical exam unremarkable. -Order chest x-ray to rule out lower lung pathology. -Order basic blood work including CBC, liver and kidney function tests, and pancreatic enzymes. -If results are normal and pain persists, consider CT scan of abdomen and pelvis with contrast. -Advise patient to seek urgent care if symptoms worsen overnight.        RTC in 3 months for your annual exam Nicki Reaper, NP

## 2023-03-07 NOTE — Telephone Encounter (Signed)
     Chief Complaint: Pt. Has had left side chest pain on and off x 1 week. Hurts to lay on that side, hurts into her back. Hurts to take a deep breath at times. Has had nausea. Symptoms: Above, pain 3-4/10 Frequency: 1 week Pertinent Negatives: Patient denies  Disposition: [] ED /[] Urgent Care (no appt availability in office) / [x] Appointment(In office/virtual)/ []  [facility]/ [] Home Care/ [] Refused Recommended Disposition /[] [facility]/ []  Follow-up with PCP Additional Notes: Will go to ED for worsening of symptoms.  Reason for Disposition  [1] Chest pain lasts > 5 minutes AND [2] occurred > 3 days ago (72 hours) AND [3] NO chest pain or cardiac symptoms now  Answer Assessment - Initial Assessment Questions 1. LOCATION: "Where does it hurt?"       Left side into back 2. RADIATION: "Does the pain go anywhere else?" (e.g., into neck, jaw, arms, back)     Back 3. ONSET: "When did the chest pain begin?" (Minutes, hours or days)      1 week ago 4. PATTERN: "Does the pain come and go, or has it been constant since it started?"  "Does it get worse with exertion?"      Comes and goes 5. DURATION: "How long does it last" (e.g., seconds, minutes, hours)     Minutes 6. SEVERITY: "How bad is the pain?"  (e.g., Scale 1-10; mild, moderate, or severe)    - MILD (1-3): doesn't interfere with normal activities     - MODERATE (4-7): interferes with normal activities or awakens from sleep    - SEVERE (8-10): excruciating pain, unable to do any normal activities       Now - 3-4 7. CARDIAC RISK FACTORS: "Do you have any history of heart problems or risk factors for heart disease?" (e.g., angina, prior heart attack; diabetes, high blood pressure, high cholesterol, smoker, or strong family history of heart disease)     no 8. PULMONARY RISK FACTORS: "Do you have any history of lung disease?"  (e.g., blood clots in lung, asthma, emphysema, birth control pills)     No 9. CAUSE: "What  do you think is causing the chest pain?"     Unsure 10. OTHER SYMPTOMS: "Do you have any other symptoms?" (e.g., dizziness, nausea, vomiting, sweating, fever, difficulty breathing, cough)       Hurts to lay on left side, nausea 11. PREGNANCY: "Is there any chance you are pregnant?" "When was your last menstrual period?"       No  Protocols used: Chest Pain-A-AH

## 2023-03-07 NOTE — Patient Instructions (Signed)
Abdominal Pain, Adult    Many things can cause belly (abdominal) pain. In most cases, belly pain is not a serious problem and can be watched and treated at home. But in some cases, it can be serious.  Your doctor will try to find the cause of your belly pain.  Follow these instructions at home:  Medicines  Take over-the-counter and prescription medicines only as told by your doctor.  Do not take medicines that help you poop (laxatives) unless told by your doctor.  General instructions  Watch your belly pain for any changes. Tell your doctor if the pain gets worse.  Drink enough fluid to keep your pee (urine) pale yellow.  Contact a doctor if:  Your belly pain changes or gets worse.  You have very bad cramping or bloating in your belly.  You vomit.  Your pain gets worse with meals, after eating, or with certain foods.  You have trouble pooping or have watery poop for more than 2-3 days.  You are not hungry, or you lose weight without trying.  You have signs of not getting enough fluid or water (dehydration). These may include:  Dark pee, very Chastain pee, or no pee.  Cracked lips or dry mouth.  Feeling sleepy or weak.  You have pain when you pee or poop.  Your belly pain wakes you up at night.  You have blood in your pee.  You have a fever.  Get help right away if:  You cannot stop vomiting.  Your pain is only in one part of your belly, like on the right side.  You have bloody or black poop, or poop that looks like tar.  You have trouble breathing.  You have chest pain.  These symptoms may be an emergency. Get help right away. Call 911.  Do not wait to see if the symptoms will go away.  Do not drive yourself to the hospital.  This information is not intended to replace advice given to you by your health care provider. Make sure you discuss any questions you have with your health care provider.  Document Revised: 10/26/2021 Document Reviewed: 10/26/2021  Elsevier Patient Education  2024 [employer].

## 2023-03-08 ENCOUNTER — Encounter: Payer: Self-pay | Admitting: Internal Medicine

## 2023-03-08 LAB — COMPLETE METABOLIC PANEL WITH GFR
AG Ratio: 1.6 (calc) (ref 1.0–2.5)
ALT: 19 U/L (ref 6–29)
AST: 18 U/L (ref 10–35)
Albumin: 4.8 g/dL (ref 3.6–5.1)
Alkaline phosphatase (APISO): 64 U/L (ref 31–125)
BUN: 11 mg/dL (ref 7–25)
CO2: 26 mmol/L (ref 20–32)
Calcium: 9.7 mg/dL (ref 8.6–10.2)
Chloride: 104 mmol/L (ref 98–110)
Creat: 0.78 mg/dL (ref 0.50–0.99)
Globulin: 3 g/dL (ref 1.9–3.7)
Glucose, Bld: 115 mg/dL (ref 65–139)
Potassium: 3.8 mmol/L (ref 3.5–5.3)
Sodium: 137 mmol/L (ref 135–146)
Total Bilirubin: 0.3 mg/dL (ref 0.2–1.2)
Total Protein: 7.8 g/dL (ref 6.1–8.1)
eGFR: 93 mL/min/{1.73_m2} (ref >=60)

## 2023-03-08 LAB — CBC
HCT: 40.1 % (ref 35.0–45.0)
Hemoglobin: 13.5 g/dL (ref 11.7–15.5)
MCH: 30.5 pg (ref 27.0–33.0)
MCHC: 33.7 g/dL (ref 32.0–36.0)
MCV: 90.7 fL (ref 80.0–100.0)
MPV: 11.1 fL (ref 7.5–12.5)
Platelets: 186 10*3/uL (ref 140–400)
RBC: 4.42 10*6/uL (ref 3.80–5.10)
RDW: 12.2 % (ref 11.0–15.0)
WBC: 3 10*3/uL — ABNORMAL LOW (ref 3.8–10.8)

## 2023-03-08 LAB — LIPASE: Lipase: 28 U/L (ref 7–60)

## 2023-03-20 ENCOUNTER — Ambulatory Visit
Admission: RE | Admit: 2023-03-20 | Discharge: 2023-03-20 | Disposition: A | Discharge status: Home / Self Care | Payer: Commercial Managed Care - PPO | Source: Ambulatory Visit | Attending: Internal Medicine | Admitting: Internal Medicine

## 2023-03-20 ENCOUNTER — Encounter: Payer: Self-pay | Admitting: Internal Medicine

## 2023-03-20 DIAGNOSIS — Z1231 Encounter for screening mammogram for malignant neoplasm of breast: Secondary | ICD-10-CM | POA: Insufficient documentation

## 2023-06-15 ENCOUNTER — Encounter: Payer: Self-pay | Admitting: Internal Medicine

## 2023-06-15 ENCOUNTER — Ambulatory Visit (INDEPENDENT_AMBULATORY_CARE_PROVIDER_SITE_OTHER): Payer: Commercial Managed Care - PPO | Admitting: Internal Medicine

## 2023-06-15 VITALS — BP 124/86 | Ht 64.0 in | Wt 177.4 lb

## 2023-06-15 DIAGNOSIS — R635 Abnormal weight gain: Secondary | ICD-10-CM

## 2023-06-15 DIAGNOSIS — R590 Localized enlarged lymph nodes: Secondary | ICD-10-CM

## 2023-06-15 DIAGNOSIS — E039 Hypothyroidism, unspecified: Secondary | ICD-10-CM | POA: Diagnosis not present

## 2023-06-15 DIAGNOSIS — R5383 Other fatigue: Secondary | ICD-10-CM

## 2023-06-15 DIAGNOSIS — R4189 Other symptoms and signs involving cognitive functions and awareness: Secondary | ICD-10-CM

## 2023-06-15 DIAGNOSIS — Z0001 Encounter for general adult medical examination with abnormal findings: Secondary | ICD-10-CM | POA: Diagnosis not present

## 2023-06-15 DIAGNOSIS — R232 Flushing: Secondary | ICD-10-CM | POA: Diagnosis not present

## 2023-06-15 DIAGNOSIS — Z6830 Body mass index (BMI) 30.0-30.9, adult: Secondary | ICD-10-CM

## 2023-06-15 DIAGNOSIS — R0602 Shortness of breath: Secondary | ICD-10-CM

## 2023-06-15 DIAGNOSIS — E66811 Obesity, class 1: Secondary | ICD-10-CM

## 2023-06-15 DIAGNOSIS — E6609 Other obesity due to excess calories: Secondary | ICD-10-CM

## 2023-06-15 DIAGNOSIS — Z136 Encounter for screening for cardiovascular disorders: Secondary | ICD-10-CM | POA: Diagnosis not present

## 2023-06-15 DIAGNOSIS — R7303 Prediabetes: Secondary | ICD-10-CM

## 2023-06-15 NOTE — Patient Instructions (Signed)

## 2023-06-15 NOTE — Assessment & Plan Note (Signed)
 Encourage diet and exercise for weight loss

## 2023-06-15 NOTE — Progress Notes (Signed)
 Subjective:    Patient ID: Jenny Castillo, adult    DOB: April 06, 1975, 48 y.o.   MRN: [id number]  HPI  Patient presents to clinic today for annual exam.  Flu: 10/2022 Tetanus: unsure COVID: Never Pap smear: 02/2021 Mammogram: 02/2023 Colon screening: 03/2022 Vision screening: annually Dentist: biannually  Diet: She does eat meat. She consumes fruits and veggies. She does eat some fried foods. She drinks mostly coffee, iced tea, and water. Exercise: treadmill 5 x week  Review of Systems     Past Medical History:  Diagnosis Date   Abnormal white blood cell (WBC)    low; white blood cell disorder   Ankylosing spondylitis (HCC)    Chronic fatigue    Galactorrhea    Immune neutropenia (HCC)    Intracranial hypertension    Thyroid  nodule     Current Outpatient Medications  Medication Sig Dispense Refill   ibuprofen (ADVIL,MOTRIN) 200 MG tablet Take 600 mg by mouth every morning.     levonorgestrel (MIRENA) 20 MCG/24HR IUD 1 each by Intrauterine route once. Inserted 01/2016     levothyroxine  (SYNTHROID ) 25 MCG tablet TAKE 1 TABLET BY MOUTH EVERY DAY BEFORE BREAKFAST 90 tablet 1   meloxicam  (MOBIC ) 15 MG tablet TAKE 1 TABLET BY MOUTH EVERY DAY (Patient not taking: Reported on 03/07/2023) 30 tablet 2   topiramate (TOPAMAX) 25 MG tablet Take 25 mg by mouth 2 (two) times daily.     No current facility-administered medications for this visit.    Allergies  Allergen Reactions   Bactrim [Sulfamethoxazole-Trimethoprim] Other (See Comments)    Has a low blood count has to stay away from certain medications    Ceftin [Cefuroxime Axetil] Other (See Comments)   Reglan [Metoclopramide] Other (See Comments)    Heavy pressure in chest , can't breathe   Sulfa Antibiotics Other (See Comments)   Rocephin [Ceftriaxone Sodium In Dextrose] Rash    Swelling     Family History  Problem Relation Age of Onset   Cancer Mother 77       kidney-renal cell   Stroke Father    Hypertension Father     Heart disease Father    Healthy Sister    Healthy Sister    Asthma Maternal Grandmother    Hypertension Maternal Grandmother    Stroke Paternal Grandmother    Breast cancer Neg Hx     Social History   Socioeconomic History   Marital status: Married    Spouse name: Not on file   Number of children: 3   Years of education: 14   Highest education level: Not on file  Occupational History   Occupation: insurance  Tobacco Use   Smoking status: Former    Current packs/day: 0.00    Types: Cigarettes    Quit date: 01/18/1997    Years since quitting: 26.4   Smokeless tobacco: Never  Vaping Use   Vaping status: Never Used  Substance and Sexual Activity   Alcohol use: Yes    Alcohol/week: 0.0 standard drinks of alcohol   Drug use: No   Sexual activity: Yes  Other Topics Concern   Not on file  Social History Narrative   Not on file   Social Drivers of Health   Financial Resource Strain: Patient Declined (12/15/2022)   Overall Financial Resource Strain (CARDIA)    Difficulty of Paying Living Expenses: Patient declined  Food Insecurity: No Food Insecurity (12/15/2022)   Hunger Vital Sign    Worried About Running Out of Food  in the Last Year: Never true    Ran Out of Food in the Last Year: Never true  Transportation Needs: No Transportation Needs (12/15/2022)   PRAPARE - Administrator, Civil Service (Medical): No    Lack of Transportation (Non-Medical): No  Physical Activity: Patient Declined (12/15/2022)   Exercise Vital Sign    Days of Exercise per Week: Patient declined    Minutes of Exercise per Session: Patient declined  Stress: Patient Declined (12/15/2022)   [employer] of Occupational Health - Occupational Stress Questionnaire    Feeling of Stress : Patient declined  Social Connections: Unknown (12/15/2022)   Social Connection and Isolation Panel [NHANES]    Frequency of Communication with Friends and Family: More than three times a week     Frequency of Social Gatherings with Friends and Family: Not on file    Attends Religious Services: Not on file    Active Member of Clubs or Organizations: Not on file    Attends Banker Meetings: Not on file    Marital Status: Not on file  Intimate Partner Violence: Not At Risk (12/15/2022)   Humiliation, Afraid, Rape, and Kick questionnaire    Fear of Current or Ex-Partner: No    Emotionally Abused: No    Physically Abused: No    Sexually Abused: No     Constitutional: Pt reports fatigue, difficulty losing weight. Denies fever, malaise, headache or abrupt weight changes.  HEENT: Denies eye pain, eye redness, ear pain, ringing in the ears, wax buildup, runny nose, nasal congestion, bloody nose, or sore throat. Respiratory: Pt reports intermittent shortness of breath. Denies difficulty breathing,  cough or sputum production.   Cardiovascular: Denies chest pain, chest tightness, palpitations or swelling in the hands or feet.  Gastrointestinal: Pt reports intermittent reflux. Denies abdominal pain, bloating, constipation, diarrhea or blood in the stool.  GU: Denies urgency, frequency, pain with urination, burning sensation, blood in urine, odor or discharge. Musculoskeletal: Pt reports intermittent joint pain. Denies decrease in range of motion, difficulty with gait, muscle pain or joint swelling.  Skin: Pt reports intermittent pain in her left axilla. Denies redness, rashes, lesions or ulcercations.  Neurological: Pt reports insomnia, hot flashes, brain fog. Denies dizziness, difficulty with memory, difficulty with speech or problems with balance and coordination.  Psych: Denies anxiety, depression, SI/HI.  No other specific complaints in a complete review of systems (except as listed in HPI above).  Objective:   Physical Exam  BP 124/86 (BP Location: Left Arm, Patient Position: Sitting, Cuff Size: Normal)   Ht 5\' 4"  (1.626 m)   Wt 177 lb 6.4 oz (80.5 kg)   BMI 30.45 kg/m     Wt Readings from Last 3 Encounters:  03/07/23 175 lb 6.4 oz (79.6 kg)  12/15/22 175 lb (79.4 kg)  04/03/22 176 lb (79.8 kg)    General: Appears her stated age, obese, in NAD. Skin: Warm, dry and intact. Axillary lymphadenopathy noted on the left HEENT: Head: normal shape and size; Eyes: sclera white, no icterus, conjunctiva pink, PERRLA and EOMs intact; Ears: Tm's gray and intact, normal light reflex;  Neck:  Neck supple, trachea midline. No masses, lumps or thyromegaly present.  Cardiovascular: Normal rate and rhythm. S1,S2 noted.  No murmur, rubs or gallops noted. No JVD or BLE edema. Pulmonary/Chest: Normal effort and positive vesicular breath sounds. No respiratory distress. No wheezes, rales or ronchi noted.  Abdomen: Normal bowel sounds.  Musculoskeletal: Strength 5/5 BUE/BLE. No difficulty with gait.  Neurological: Alert and oriented. Cranial nerves II-XII grossly intact. Coordination normal.  Psychiatric: Mood and affect normal. Behavior is normal. Judgment and thought content normal.     BMET    Component Value Date/Time   NA 137 03/07/2023 1050   K 3.8 03/07/2023 1050   CL 104 03/07/2023 1050   CO2 26 03/07/2023 1050   GLUCOSE 115 03/07/2023 1050   BUN 11 03/07/2023 1050   CREATININE 0.78 03/07/2023 1050   CALCIUM 9.7 03/07/2023 1050   GFRNONAA >60 08/23/2020 1112    Lipid Panel     Component Value Date/Time   CHOL 154 12/15/2022 1549   TRIG 140 12/15/2022 1549   HDL 44 (L) 12/15/2022 1549   CHOLHDL 3.5 12/15/2022 1549   VLDL 14.4 12/08/2019 1519   LDLCALC 86 12/15/2022 1549    CBC    Component Value Date/Time   WBC 3.0 (L) 03/07/2023 1050   RBC 4.42 03/07/2023 1050   HGB 13.5 03/07/2023 1050   HCT 40.1 03/07/2023 1050   PLT 186 03/07/2023 1050   MCV 90.7 03/07/2023 1050   MCH 30.5 03/07/2023 1050   MCHC 33.7 03/07/2023 1050   RDW 12.2 03/07/2023 1050   LYMPHSABS 2,024 12/01/2021 1539   MONOABS 0.5 01/19/2020 1141   EOSABS 98 12/15/2022 1549    BASOSABS 31 12/15/2022 1549    Hgb A1C Lab Results  Component Value Date   HGBA1C 5.7 (H) 12/15/2022           Assessment & Plan:   Preventative Health Maintenance:  Encouraged her to get a flu shot in the fall She declines Tetanus booster Encouraged her to get her COVID-vaccine Pap smear UTD Mammogram UTD Colon screening UTD Encouraged her to consume a balanced diet and exercise regimen Advised her to see an eye doctor and dentist annually Will check CBC, CMET, TSH, Free T4, lipid and A1C today  Axillary lymphadenopathy:  She declines diagnostic mammogram and US  axilla at this time Prefers to monitor for now  Shortness of breath, screening for ischemic heart disease:  CT cardiac calcium score ordered  Fatigue, difficulty losing weight, hot flashes, brain fog:  Could be perimenopausal IUD past due to be taken out, advised her to follow up with gyn regarding this Will check FSH/LH today  RTC in 6 months for followup chronic conditions Helayne Lo, NP

## 2023-06-19 ENCOUNTER — Ambulatory Visit: Payer: Self-pay | Admitting: Internal Medicine

## 2023-06-27 ENCOUNTER — Ambulatory Visit
Admission: RE | Admit: 2023-06-27 | Discharge: 2023-06-27 | Disposition: A | Discharge status: Home / Self Care | Payer: Self-pay | Source: Ambulatory Visit | Attending: Internal Medicine | Admitting: Internal Medicine

## 2023-06-27 DIAGNOSIS — Z136 Encounter for screening for cardiovascular disorders: Secondary | ICD-10-CM | POA: Insufficient documentation

## 2023-08-08 ENCOUNTER — Other Ambulatory Visit: Payer: Self-pay | Admitting: Internal Medicine

## 2023-08-09 NOTE — Telephone Encounter (Signed)
 Requested Prescriptions  Pending Prescriptions Disp Refills   levothyroxine  (SYNTHROID ) 25 MCG tablet [Pharmacy Med Name: LEVOTHYROXINE  25 MCG TABLET] 90 tablet 1    Sig: TAKE 1 TABLET BY MOUTH EVERY DAY BEFORE BREAKFAST     Endocrinology:  Hypothyroid Agents Passed - 08/09/2023  1:28 PM      Passed - TSH in normal range and within 360 days    TSH  Date Value Ref Range Status  06/15/2023 1.98 mIU/L Final    Comment:              Reference Range .           > or = 20 Years  0.40-4.50 .                Pregnancy Ranges           First trimester    0.26-2.66           Second trimester   0.55-2.73           Third trimester    0.43-2.91          Passed - Valid encounter within last 12 months    Recent Outpatient Visits           1 month ago Encounter for general adult medical examination with abnormal findings   [place] [facility] [place], Angeline ORN, NP   5 months ago LUQ abdominal pain   [place] [facility], Angeline ORN, [place]

## 2023-11-23 LAB — COMPREHENSIVE METABOLIC PANEL WITH GFR
AG Ratio: 1.4 (calc) (ref 1.0–2.5)
ALT: 16 U/L (ref 6–29)
AST: 16 U/L (ref 10–35)
Albumin: 4.6 g/dL (ref 3.6–5.1)
Alkaline phosphatase (APISO): 75 U/L (ref 31–125)
BUN: 12 mg/dL (ref 7–25)
CO2: 24 mmol/L (ref 20–32)
Calcium: 9.5 mg/dL (ref 8.6–10.2)
Chloride: 107 mmol/L (ref 98–110)
Creat: 0.84 mg/dL (ref 0.50–0.99)
Globulin: 3.3 g/dL (ref 1.9–3.7)
Glucose, Bld: 95 mg/dL (ref 65–139)
Potassium: 3.6 mmol/L (ref 3.5–5.3)
Sodium: 140 mmol/L (ref 135–146)
Total Bilirubin: 0.2 mg/dL (ref 0.2–1.2)
Total Protein: 7.9 g/dL (ref 6.1–8.1)
eGFR: 85 mL/min/1.73m2 (ref >=60)

## 2023-11-23 LAB — CBC
HCT: 41.7 % (ref 35.0–45.0)
Hemoglobin: 13.7 g/dL (ref 11.7–15.5)
MCH: 31.1 pg (ref 27.0–33.0)
MCHC: 32.9 g/dL (ref 32.0–36.0)
MCV: 94.6 fL (ref 80.0–100.0)
MPV: 10.9 fL (ref 7.5–12.5)
Platelets: 195 Thousand/uL (ref 140–400)
RBC: 4.41 Million/uL (ref 3.80–5.10)
RDW: 12.6 % (ref 11.0–15.0)
WBC: 4 Thousand/uL (ref 3.8–10.8)

## 2023-11-23 LAB — TSH: TSH: 1.98 m[IU]/L

## 2023-11-23 LAB — LIPID PANEL
Cholesterol: 150 mg/dL (ref <200)
HDL: 42 mg/dL — ABNORMAL LOW (ref >=50)
LDL Cholesterol (Calc): 84 mg/dL
Non-HDL Cholesterol (Calc): 108 mg/dL (ref <130)
Total CHOL/HDL Ratio: 3.6 (calc) (ref <5.0)
Triglycerides: 147 mg/dL (ref <150)

## 2023-11-23 LAB — FSH/LH
FSH: 74.2 m[IU]/mL
LH: 34 m[IU]/mL

## 2023-11-23 LAB — HEMOGLOBIN A1C
Hgb A1c MFr Bld: 5.7 % — ABNORMAL HIGH (ref <5.7)
Mean Plasma Glucose: 117 mg/dL
eAG (mmol/L): 6.5 mmol/L

## 2023-11-23 LAB — T4, FREE: Free T4: 1 ng/dL (ref 0.8–1.8)

## 2023-12-24 ENCOUNTER — Ambulatory Visit: Admitting: Internal Medicine

## 2023-12-24 ENCOUNTER — Encounter: Payer: Self-pay | Admitting: Internal Medicine

## 2023-12-24 VITALS — BP 128/78 | Ht 64.0 in | Wt 175.4 lb

## 2023-12-24 DIAGNOSIS — Z1231 Encounter for screening mammogram for malignant neoplasm of breast: Secondary | ICD-10-CM

## 2023-12-24 DIAGNOSIS — E039 Hypothyroidism, unspecified: Secondary | ICD-10-CM | POA: Diagnosis not present

## 2023-12-24 DIAGNOSIS — D709 Neutropenia, unspecified: Secondary | ICD-10-CM | POA: Diagnosis not present

## 2023-12-24 DIAGNOSIS — R7303 Prediabetes: Secondary | ICD-10-CM | POA: Diagnosis not present

## 2023-12-24 DIAGNOSIS — R590 Localized enlarged lymph nodes: Secondary | ICD-10-CM

## 2023-12-24 DIAGNOSIS — G932 Benign intracranial hypertension: Secondary | ICD-10-CM

## 2023-12-24 DIAGNOSIS — E66811 Obesity, class 1: Secondary | ICD-10-CM

## 2023-12-24 DIAGNOSIS — Z136 Encounter for screening for cardiovascular disorders: Secondary | ICD-10-CM

## 2023-12-24 DIAGNOSIS — Z6830 Body mass index (BMI) 30.0-30.9, adult: Secondary | ICD-10-CM

## 2023-12-24 DIAGNOSIS — M459 Ankylosing spondylitis of unspecified sites in spine: Secondary | ICD-10-CM

## 2023-12-24 DIAGNOSIS — E6609 Other obesity due to excess calories: Secondary | ICD-10-CM

## 2023-12-24 LAB — CBC WITH DIFFERENTIAL/PLATELET
Absolute Lymphocytes: 1566 {cells}/uL (ref 850–3900)
Absolute Monocytes: 529 {cells}/uL (ref 200–950)
Basophils Absolute: 29 {cells}/uL (ref 0–200)
Basophils Relative: 0.8 %
Eosinophils Absolute: 68 {cells}/uL (ref 15–500)
Eosinophils Relative: 1.9 %
HCT: 41.4 % (ref 35.9–46.0)
Hemoglobin: 13.9 g/dL (ref 11.7–15.5)
MCH: 31.2 pg (ref 27.0–33.0)
MCHC: 33.6 g/dL (ref 31.6–35.4)
MCV: 92.8 fL (ref 81.4–101.7)
MPV: 11.4 fL (ref 7.5–12.5)
Monocytes Relative: 14.7 %
Neutro Abs: 1408 {cells}/uL — ABNORMAL LOW (ref 1500–7800)
Neutrophils Relative %: 39.1 %
Platelets: 196 Thousand/uL (ref 140–400)
RBC: 4.46 Million/uL (ref 3.80–5.10)
RDW: 12.3 % (ref 11.0–15.0)
Total Lymphocyte: 43.5 %
WBC: 3.6 Thousand/uL — ABNORMAL LOW (ref 3.8–10.8)

## 2023-12-24 LAB — LIPID PANEL
Cholesterol: 150 mg/dL (ref <200)
HDL: 42 mg/dL — ABNORMAL LOW (ref >=50)
LDL Cholesterol (Calc): 83 mg/dL
Non-HDL Cholesterol (Calc): 108 mg/dL (ref <130)
Total CHOL/HDL Ratio: 3.6 (calc) (ref <5.0)
Triglycerides: 145 mg/dL (ref <150)

## 2023-12-24 LAB — HEMOGLOBIN A1C
Hgb A1c MFr Bld: 5.7 % — ABNORMAL HIGH (ref <5.7)
Mean Plasma Glucose: 117 mg/dL
eAG (mmol/L): 6.5 mmol/L

## 2023-12-24 LAB — T4, FREE: Free T4: 1 ng/dL (ref 0.8–1.8)

## 2023-12-24 LAB — TSH: TSH: 2.18 m[IU]/L

## 2023-12-24 NOTE — Patient Instructions (Signed)

## 2023-12-24 NOTE — Assessment & Plan Note (Signed)
 CBC with differential today She will continue to follow with hematology

## 2023-12-24 NOTE — Assessment & Plan Note (Signed)
 Encourage diet and exercise for weight loss

## 2023-12-24 NOTE — Assessment & Plan Note (Signed)
 TSH and free T4 today Will adjust levothyroxine   25 mcg if needed based on labs

## 2023-12-24 NOTE — Assessment & Plan Note (Signed)
 Continue acetazolamide 125 mg BID She will continue to follow with neurology

## 2023-12-24 NOTE — Progress Notes (Signed)
 Subjective:    Patient ID: Jenny Castillo, adult    DOB: 1975-03-07, 48 y.o.   MRN: [id number]  HPI  Patient presents the clinic today for follow-up chronic conditions.  Hypothyroidism: She denies any issues on her current dose of levothyroxine .  She does not follow with endocrinology.  Immune neutropenia: Her last WBC count was 4, 05/2023.  She is following with hematology.  Ankylosing spondylitis: Managed with ibuprofen as needed.  She was on humira/hyrimoz in the past.  She follows with rheumatology.  Prediabetes: Her last A1C was 5.7%, 05/2023. She is not taking any oral diabetic medication at this time. She does not check her sugars.  ICH: She has intermittent headaches. She is no taking topirimate but is taking acetazolamide as prescribed. She follows with neurology.    Review of Systems     Past Medical History:  Diagnosis Date   Abnormal white blood cell (WBC)    low; white blood cell disorder   Ankylosing spondylitis (HCC)    Chronic fatigue    Galactorrhea    Immune neutropenia    Intracranial hypertension    Thyroid  nodule     Current Outpatient Medications  Medication Sig Dispense Refill   acetaZOLAMIDE (DIAMOX) 125 MG tablet Take 125 mg by mouth 2 (two) times daily.     ibuprofen (ADVIL,MOTRIN) 200 MG tablet Take 600 mg by mouth every morning.     levonorgestrel (MIRENA) 20 MCG/24HR IUD 1 each by Intrauterine route once. Inserted 01/2016     levothyroxine  (SYNTHROID ) 25 MCG tablet TAKE 1 TABLET BY MOUTH EVERY DAY BEFORE BREAKFAST 90 tablet 1   meloxicam  (MOBIC ) 15 MG tablet TAKE 1 TABLET BY MOUTH EVERY DAY (Patient not taking: Reported on 12/15/2022) 30 tablet 2   topiramate (TOPAMAX) 25 MG tablet Take 25 mg by mouth 2 (two) times daily. (Patient not taking: Reported on 06/15/2023)     No current facility-administered medications for this visit.    Allergies  Allergen Reactions   Bactrim [Sulfamethoxazole-Trimethoprim] Other (See Comments)    Has a low blood  count has to stay away from certain medications    Ceftin [Cefuroxime Axetil] Other (See Comments)   Reglan [Metoclopramide] Other (See Comments)    Heavy pressure in chest , can't breathe   Sulfa Antibiotics Other (See Comments)   Rocephin [Ceftriaxone Sodium In Dextrose] Rash    Swelling     Family History  Problem Relation Age of Onset   Cancer Mother 62       kidney-renal cell   Stroke Father    Hypertension Father    Heart disease Father    Healthy Sister    Healthy Sister    Asthma Maternal Grandmother    Hypertension Maternal Grandmother    Stroke Paternal Grandmother    Breast cancer Neg Hx     Social History   Socioeconomic History   Marital status: Married    Spouse name: Not on file   Number of children: 3   Years of education: 14   Highest education level: Some college, no degree  Occupational History   Occupation: insurance  Tobacco Use   Smoking status: Former    Current packs/day: 0.00    Types: Cigarettes    Quit date: 01/18/1997    Years since quitting: 26.9   Smokeless tobacco: Never  Vaping Use   Vaping status: Never Used  Substance and Sexual Activity   Alcohol use: Yes    Alcohol/week: 0.0 standard drinks of alcohol  Drug use: No   Sexual activity: Yes  Other Topics Concern   Not on file  Social History Narrative   Not on file   Social Drivers of Health   Financial Resource Strain: Low Risk  (12/23/2023)   Overall Financial Resource Strain (CARDIA)    Difficulty of Paying Living Expenses: Not very hard  Food Insecurity: No Food Insecurity (12/23/2023)   Hunger Vital Sign    Worried About Running Out of Food in the Last Year: Never true    Ran Out of Food in the Last Year: Never true  Transportation Needs: No Transportation Needs (12/23/2023)   PRAPARE - Administrator, Civil Service (Medical): No    Lack of Transportation (Non-Medical): No  Physical Activity: Insufficiently Active (12/23/2023)   Exercise Vital Sign     Days of Exercise per Week: 5 days    Minutes of Exercise per Session: 20 min  Stress: No Stress Concern Present (12/23/2023)   [employer] of Occupational Health - Occupational Stress Questionnaire    Feeling of Stress: Not at all  Social Connections: Socially Integrated (12/23/2023)   Social Connection and Isolation Panel    Frequency of Communication with Friends and Family: More than three times a week    Frequency of Social Gatherings with Friends and Family: Three times a week    Attends Religious Services: 1 to 4 times per year    Active Member of Clubs or Organizations: Yes    Attends Banker Meetings: 1 to 4 times per year    Marital Status: Married  Catering Manager Violence: Not At Risk (12/15/2022)   Humiliation, Afraid, Rape, and Kick questionnaire    Fear of Current or Ex-Partner: No    Emotionally Abused: No    Physically Abused: No    Sexually Abused: No     Constitutional: Pt reports intermittent headaches. Denies fever, malaise, fatigue, or abrupt weight changes.  HEENT: Denies eye pain, eye redness, ear pain, ringing in the ears, wax buildup, runny nose, nasal congestion, bloody nose, or sore throat. Respiratory: Denies difficulty breathing, shortness of breath, cough or sputum production.   Cardiovascular: Denies chest pain, chest tightness, palpitations or swelling in the hands or feet.  Gastrointestinal: Denies abdominal pain, bloating, constipation, diarrhea or blood in the stool.  GU: Denies urgency, frequency, pain with urination, burning sensation, blood in urine, odor or discharge. Musculoskeletal: Pt reports joint pain. Denies decrease in range of motion, difficulty with gait, muscle pain or joint swelling.  Skin: Denies redness, rashes, lesions or ulcercations.  Neurological: Denies dizziness, difficulty with memory, difficulty with speech or problems with balance and coordination.  Psych: Denies anxiety, depression, SI/HI.  No other  specific complaints in a complete review of systems (except as listed in HPI above).  Objective:   Physical Exam  There were no vitals taken for this visit.   Wt Readings from Last 3 Encounters:  06/15/23 177 lb 6.4 oz (80.5 kg)  03/07/23 175 lb 6.4 oz (79.6 kg)  12/15/22 175 lb (79.4 kg)    General: Appears her stated age, obese in NAD. Skin: Warm, dry and intact.  HEENT: Head: normal shape and size; Eyes: sclera white and EOMs intact;  Neck:  Neck supple, trachea midline. No masses, lumps or thyromegaly present.  Cardiovascular: Normal rate and rhythm. S1,S2 noted.  No murmur, rubs or gallops noted. Pulmonary/Chest: Normal effort and positive vesicular breath sounds. No respiratory distress. No wheezes, rales or ronchi noted.  Musculoskeletal:  Pain with palpation of the left anterior ribs.. No difficulty with gait.  Neurological: Alert and oriented. Coordination normal.    BMET    Component Value Date/Time   NA 140 06/15/2023 1551   K 3.6 06/15/2023 1551   CL 107 06/15/2023 1551   CO2 24 06/15/2023 1551   GLUCOSE 95 06/15/2023 1551   BUN 12 06/15/2023 1551   CREATININE 0.84 06/15/2023 1551   CALCIUM 9.5 06/15/2023 1551   GFRNONAA >60 08/23/2020 1112    Lipid Panel     Component Value Date/Time   Castillo 150 06/15/2023 1551   TRIG 147 06/15/2023 1551   HDL 42 (L) 06/15/2023 1551   CHOLHDL 3.6 06/15/2023 1551   VLDL 14.4 12/08/2019 1519   LDLCALC 84 06/15/2023 1551    CBC    Component Value Date/Time   WBC 4.0 06/15/2023 1551   RBC 4.41 06/15/2023 1551   HGB 13.7 06/15/2023 1551   HCT 41.7 06/15/2023 1551   PLT 195 06/15/2023 1551   MCV 94.6 06/15/2023 1551   MCH 31.1 06/15/2023 1551   MCHC 32.9 06/15/2023 1551   RDW 12.6 06/15/2023 1551   LYMPHSABS 2,024 12/01/2021 1539   MONOABS 0.5 01/19/2020 1141   EOSABS 98 12/15/2022 1549   BASOSABS 31 12/15/2022 1549    Hgb A1C Lab Results  Component Value Date   HGBA1C 5.7 (H) 06/15/2023            Assessment & Plan:    RTC in 6 months for your annual exam Angeline Laura, NP

## 2023-12-24 NOTE — Assessment & Plan Note (Signed)
 Managed with ibuprofen OTC She will continue to follow with rheumatology

## 2023-12-24 NOTE — Assessment & Plan Note (Addendum)
 Complicated by obesity A1c today Encouraged low carb diet

## 2023-12-25 ENCOUNTER — Ambulatory Visit: Payer: Self-pay | Admitting: Internal Medicine

## 2024-01-09 ENCOUNTER — Other Ambulatory Visit: Payer: Self-pay | Admitting: Internal Medicine

## 2024-01-09 DIAGNOSIS — Z1231 Encounter for screening mammogram for malignant neoplasm of breast: Secondary | ICD-10-CM

## 2024-01-09 DIAGNOSIS — R928 Other abnormal and inconclusive findings on diagnostic imaging of breast: Secondary | ICD-10-CM

## 2024-01-09 DIAGNOSIS — R590 Localized enlarged lymph nodes: Secondary | ICD-10-CM

## 2024-02-05 ENCOUNTER — Other Ambulatory Visit: Payer: Self-pay | Admitting: Internal Medicine

## 2024-02-05 ENCOUNTER — Ambulatory Visit
Admission: RE | Admit: 2024-02-05 | Discharge: 2024-02-05 | Disposition: A | Source: Ambulatory Visit | Attending: Internal Medicine | Admitting: Internal Medicine

## 2024-02-05 ENCOUNTER — Ambulatory Visit
Admission: RE | Admit: 2024-02-05 | Discharge: 2024-02-05 | Disposition: A | Discharge status: Home / Self Care | Source: Ambulatory Visit | Attending: Internal Medicine | Admitting: Internal Medicine

## 2024-02-05 DIAGNOSIS — R590 Localized enlarged lymph nodes: Secondary | ICD-10-CM

## 2024-02-05 DIAGNOSIS — Z1231 Encounter for screening mammogram for malignant neoplasm of breast: Secondary | ICD-10-CM

## 2024-02-05 DIAGNOSIS — R928 Other abnormal and inconclusive findings on diagnostic imaging of breast: Secondary | ICD-10-CM

## 2024-02-12 ENCOUNTER — Other Ambulatory Visit: Payer: Self-pay | Admitting: Internal Medicine

## 2024-02-12 NOTE — Telephone Encounter (Signed)
 Requested Prescriptions  Pending Prescriptions Disp Refills   levothyroxine  (SYNTHROID ) 25 MCG tablet [Pharmacy Med Name: LEVOTHYROXINE  25 MCG TABLET] 90 tablet 1    Sig: TAKE 1 TABLET BY MOUTH EVERY DAY BEFORE BREAKFAST     Endocrinology:  Hypothyroid Agents Passed - 02/12/2024  2:31 PM      Passed - TSH in normal range and within 360 days    TSH  Date Value Ref Range Status  12/24/2023 2.18 mIU/L Final    Comment:              Reference Range .           > or = 20 Years  0.40-4.50 .                Pregnancy Ranges           First trimester    0.26-2.66           Second trimester   0.55-2.73           Third trimester    0.43-2.91          Passed - Valid encounter within last 12 months    Recent Outpatient Visits           1 month ago Prediabetes   [place] [facility] [place], Angeline ORN, NP   8 months ago Encounter for general adult medical examination with abnormal findings   [place] [facility] [place], Angeline ORN, NP   11 months ago LUQ abdominal pain    [facility] [place], Angeline ORN, [place]

## 2024-03-06 ENCOUNTER — Ambulatory Visit: Admitting: Obstetrics and Gynecology

## 2024-06-17 ENCOUNTER — Encounter: Admitting: Internal Medicine
